# Patient Record
Sex: Male | Born: 1950 | Race: Black or African American | Hispanic: No | Marital: Married | Smoking: Former smoker
Health system: Southern US, Community
[De-identification: ages and names within clinical notes are randomized; demographics above are authoritative.]

## PROBLEM LIST (undated history)

## (undated) DIAGNOSIS — T7840XA Allergy, unspecified, initial encounter: Secondary | ICD-10-CM

## (undated) DIAGNOSIS — M1990 Unspecified osteoarthritis, unspecified site: Secondary | ICD-10-CM

## (undated) DIAGNOSIS — N200 Calculus of kidney: Secondary | ICD-10-CM

## (undated) DIAGNOSIS — I10 Essential (primary) hypertension: Secondary | ICD-10-CM

## (undated) DIAGNOSIS — E785 Hyperlipidemia, unspecified: Secondary | ICD-10-CM

## (undated) HISTORY — PX: COLONOSCOPY: SHX174

## (undated) HISTORY — DX: Unspecified osteoarthritis, unspecified site: M19.90

## (undated) HISTORY — PX: HERNIA REPAIR: SHX51

## (undated) HISTORY — DX: Calculus of kidney: N20.0

## (undated) HISTORY — DX: Allergy, unspecified, initial encounter: T78.40XA

## (undated) HISTORY — DX: Essential (primary) hypertension: I10

## (undated) HISTORY — DX: Hyperlipidemia, unspecified: E78.5

---

## 1999-09-03 ENCOUNTER — Ambulatory Visit (HOSPITAL_COMMUNITY): Admission: RE | Admit: 1999-09-03 | Discharge: 1999-09-03 | Payer: Self-pay | Admitting: Anesthesiology

## 2000-05-19 ENCOUNTER — Emergency Department (HOSPITAL_COMMUNITY): Admission: EM | Admit: 2000-05-19 | Discharge: 2000-05-19 | Payer: Self-pay | Admitting: Emergency Medicine

## 2001-04-29 ENCOUNTER — Encounter: Payer: Self-pay | Admitting: Urology

## 2001-04-29 ENCOUNTER — Encounter: Admission: RE | Admit: 2001-04-29 | Discharge: 2001-04-29 | Payer: Self-pay | Admitting: Urology

## 2010-01-01 ENCOUNTER — Ambulatory Visit (HOSPITAL_BASED_OUTPATIENT_CLINIC_OR_DEPARTMENT_OTHER): Admission: RE | Admit: 2010-01-01 | Discharge: 2010-01-01 | Payer: Self-pay | Admitting: General Surgery

## 2010-06-13 LAB — URINALYSIS, ROUTINE W REFLEX MICROSCOPIC
Bilirubin Urine: NEGATIVE
Glucose, UA: NEGATIVE mg/dL
Hgb urine dipstick: NEGATIVE
Ketones, ur: NEGATIVE mg/dL
Nitrite: NEGATIVE
Protein, ur: NEGATIVE mg/dL
Specific Gravity, Urine: 1.023 (ref 1.005–1.030)
Urobilinogen, UA: 1 mg/dL (ref 0.0–1.0)
pH: 7.5 (ref 5.0–8.0)

## 2010-06-13 LAB — POCT HEMOGLOBIN-HEMACUE: Hemoglobin: 13.5 g/dL (ref 13.0–17.0)

## 2010-08-16 NOTE — Procedures (Signed)
[place]  Patient:    Leonard Bryant, Leonard Bryant                           MRN: [id number] Proc. Date: 09/03/99 Adm. Date:  [id number] Disc. Date: [id number] Attending:  Orland Mustard CC:         Reuben Likes, M.D.                           Procedure Report  PROCEDURE:  Colonoscopy.  MEDICATIONS:  Fentanyl 90 mcg, Versed 10 mg IV.  COLONOSCOPE:  Olympus adult video colonoscope.  INDICATION:  Increasing constipation, strong family history of GI neoplasia, multiple polyps removed in several family members.  DESCRIPTION OF PROCEDURE:  The procedure had been explained to the patient and consent obtained.  With the patient in the left lateral decubitus position, the Olympus adult video colonoscope inserted following digital rectal exam and advanced under direct visualization.  Prep was excellent, and we were able to advance to the cecum without difficulty.  The scope was withdrawn, and the cecum, ascending colon, hepatic flexure, transverse colon, splenic flexure, descending and sigmoid colon were seen well upon removal without significant diverticular disease.  No polyps seen throughout.  Small internal hemorrhoids seen in the rectum upon removal.  The patient tolerated the procedure well, maintained on low-flow oxygen and pulse oximetry throughout the procedure without obvious problem.  ASSESSMENT: 1. No polyps or any colonic neoplasia. 2. Internal hemorrhoids.  PLAN:  Due to his family history, will recommend repeating in five years. DD:  09/03/99 TD:  09/06/99 Job: [id number]

## 2011-04-30 ENCOUNTER — Ambulatory Visit (INDEPENDENT_AMBULATORY_CARE_PROVIDER_SITE_OTHER): Payer: Federal, State, Local not specified - PPO | Admitting: Family Medicine

## 2011-04-30 VITALS — BP 122/80 | HR 67 | Temp 97.9°F | Resp 16 | Ht 68.0 in | Wt 183.8 lb

## 2011-04-30 DIAGNOSIS — E785 Hyperlipidemia, unspecified: Secondary | ICD-10-CM | POA: Insufficient documentation

## 2011-04-30 DIAGNOSIS — Z0000 Encounter for general adult medical examination without abnormal findings: Secondary | ICD-10-CM

## 2011-04-30 DIAGNOSIS — M47812 Spondylosis without myelopathy or radiculopathy, cervical region: Secondary | ICD-10-CM

## 2011-04-30 DIAGNOSIS — N400 Enlarged prostate without lower urinary tract symptoms: Secondary | ICD-10-CM

## 2011-04-30 LAB — POCT CBC
Granulocyte percent: 54.3 %G (ref 37–80)
HCT, POC: 40.7 % — AB (ref 43.5–53.7)
Hemoglobin: 13.4 g/dL — AB (ref 14.1–18.1)
Lymph, poc: 1.2 (ref 0.6–3.4)
MCH, POC: 27.8 pg (ref 27–31.2)
MCHC: 32.9 g/dL (ref 31.8–35.4)
MCV: 84.4 fL (ref 80–97)
MID (cbc): 0.3 (ref 0–0.9)
MPV: 8.7 fL (ref 0–99.8)
POC Granulocyte: 1.8 — AB (ref 2–6.9)
POC LYMPH PERCENT: 37.2 %L (ref 10–50)
POC MID %: 8.5 %M (ref 0–12)
Platelet Count, POC: 222 10*3/uL (ref 142–424)
RBC: 4.82 M/uL (ref 4.69–6.13)
RDW, POC: 15.1 %
WBC: 3.3 10*3/uL — AB (ref 4.6–10.2)

## 2011-04-30 LAB — POCT GLYCOSYLATED HEMOGLOBIN (HGB A1C): Hemoglobin A1C: 5.2

## 2011-04-30 LAB — POCT UA - MICROSCOPIC ONLY
Casts, Ur, LPF, POC: NEGATIVE
Mucus, UA: NEGATIVE
Yeast, UA: NEGATIVE

## 2011-04-30 LAB — POCT URINALYSIS DIPSTICK
Leukocytes, UA: NEGATIVE
Nitrite, UA: NEGATIVE
Protein, UA: NEGATIVE
Urobilinogen, UA: 0.2
pH, UA: 7.5

## 2011-04-30 LAB — IFOBT (OCCULT BLOOD): IFOBT: NEGATIVE

## 2011-04-30 NOTE — Progress Notes (Signed)
  Subjective:    Patient ID: Leonard Bryant, male    DOB: 03-07-1951, 61 y.o.   MRN: [id number]  HPI patient is here for complete physical examination. Primary concerns include pains in the left side of his neck tingling there. He also has had problems with nasal congestion and seeing some blood in his nose. He does have a history of frequent urination which possibly could be prostate related. Lower and upper back pains. Takes some ibuprofen for that.    Review of Systems  Constitutional: Negative.   HENT: Positive for nosebleeds, congestion and neck pain. Negative for rhinorrhea, sneezing and postnasal drip.   Eyes: Negative.   Respiratory: Negative.   Cardiovascular: Negative.   Gastrointestinal: Negative.   Genitourinary: Positive for frequency.  Musculoskeletal: Positive for back pain.  Neurological: Negative.   Hematological: Negative.   Psychiatric/Behavioral: Negative.    Does give a tingly sensation down the left side of his neck.    Objective:   Physical Exam        Assessment & Plan:

## 2011-05-01 LAB — LIPID PANEL
HDL: 46 mg/dL (ref >39)
LDL Cholesterol: 105 mg/dL — ABNORMAL HIGH (ref 0–99)
Total CHOL/HDL Ratio: 3.5 Ratio
Triglycerides: 49 mg/dL (ref <150)

## 2011-05-01 LAB — COMPREHENSIVE METABOLIC PANEL
ALT: 23 U/L (ref 0–53)
AST: 26 U/L (ref 0–37)
Alkaline Phosphatase: 48 U/L (ref 39–117)
Creat: 0.9 mg/dL (ref 0.50–1.35)
Sodium: 139 mEq/L (ref 135–145)
Total Bilirubin: 0.6 mg/dL (ref 0.3–1.2)

## 2011-05-01 LAB — PSA: PSA: 2.87 ng/mL (ref <=4.00)

## 2011-05-02 ENCOUNTER — Telehealth: Payer: Self-pay | Admitting: Family Medicine

## 2011-05-02 NOTE — Telephone Encounter (Signed)
Left message on the machine that patient's labs were normal. However his PSA he has in the upper normal range. He is he was advised nothing need be done with this, please make sure that he gets recheck next year

## 2011-07-03 ENCOUNTER — Other Ambulatory Visit: Payer: Self-pay | Admitting: Emergency Medicine

## 2012-02-13 ENCOUNTER — Telehealth: Payer: Self-pay

## 2012-02-13 NOTE — Telephone Encounter (Signed)
Patient advised labs at front desk

## 2012-02-13 NOTE — Telephone Encounter (Signed)
Pt had a CPE in January of 2013 and would like a copy of his labs. Please call when ready and he will pickup. Leonard Bryant  [phone]

## 2012-02-18 ENCOUNTER — Encounter: Payer: Federal, State, Local not specified - PPO | Admitting: Family Medicine

## 2012-05-05 ENCOUNTER — Encounter: Payer: Self-pay | Admitting: Family Medicine

## 2012-05-05 ENCOUNTER — Ambulatory Visit (INDEPENDENT_AMBULATORY_CARE_PROVIDER_SITE_OTHER): Payer: Federal, State, Local not specified - PPO | Admitting: Family Medicine

## 2012-05-05 VITALS — BP 140/90 | HR 65 | Temp 98.8°F | Resp 16 | Ht 68.5 in | Wt 178.0 lb

## 2012-05-05 DIAGNOSIS — N400 Enlarged prostate without lower urinary tract symptoms: Secondary | ICD-10-CM

## 2012-05-05 DIAGNOSIS — Z862 Personal history of diseases of the blood and blood-forming organs and certain disorders involving the immune mechanism: Secondary | ICD-10-CM

## 2012-05-05 DIAGNOSIS — J309 Allergic rhinitis, unspecified: Secondary | ICD-10-CM

## 2012-05-05 DIAGNOSIS — E7800 Pure hypercholesterolemia, unspecified: Secondary | ICD-10-CM

## 2012-05-05 DIAGNOSIS — Z23 Encounter for immunization: Secondary | ICD-10-CM

## 2012-05-05 DIAGNOSIS — Z0000 Encounter for general adult medical examination without abnormal findings: Secondary | ICD-10-CM

## 2012-05-05 LAB — BASIC METABOLIC PANEL
CO2: 24 mEq/L (ref 19–32)
Calcium: 9.8 mg/dL (ref 8.4–10.5)
Chloride: 109 mEq/L (ref 96–112)
Creat: 0.96 mg/dL (ref 0.50–1.35)
Glucose, Bld: 100 mg/dL — ABNORMAL HIGH (ref 70–99)
Sodium: 140 mEq/L (ref 135–145)

## 2012-05-05 LAB — POCT URINALYSIS DIPSTICK
Bilirubin, UA: NEGATIVE
Glucose, UA: NEGATIVE
Ketones, UA: NEGATIVE
Leukocytes, UA: NEGATIVE
Nitrite, UA: NEGATIVE
pH, UA: 7

## 2012-05-05 MED ORDER — FLUTICASONE PROPIONATE 50 MCG/ACT NA SUSP: 2.0000 | Freq: Every day | NASAL | Status: DC

## 2012-05-05 NOTE — Progress Notes (Signed)
Subjective:    Patient ID: Leonard Bryant, male    DOB: May 08, 1950, 62 y.o.   MRN: [id number]  HPI  This 62 y.o. AA male is here for CPE. He checks BP at home- readings= 120-140/80. He  exercises most days of the week, is a nonsmoker and has 1 alcoholic beverage daily. 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   Review of Systems  Constitutional: Negative.   HENT: Positive for hearing loss, congestion and neck pain.   Eyes: Negative.   Respiratory: Negative.   Cardiovascular: Negative.   Gastrointestinal: Negative.   Genitourinary: Negative.   Musculoskeletal:       Pt reports epigastric tightness only when bending over or with exertion such as heavy lifting; no other associated symptoms.  Skin: Negative.   Neurological: Negative.   Hematological: Negative.   Psychiatric/Behavioral: Negative.        Objective:   Physical Exam  Nursing note and vitals reviewed. Constitutional: He is oriented to person, place, and time. Vital signs are normal. He appears well-developed and well-nourished. No distress.  HENT:  Head: Normocephalic and atraumatic.  Right Ear: Tympanic membrane, external ear and ear canal normal. Decreased hearing is noted.  Left Ear: Tympanic membrane, external ear and ear canal normal. Decreased hearing is noted.  Nose: Mucosal edema present. No nasal deformity or septal deviation. Right sinus exhibits no maxillary sinus tenderness and no frontal sinus tenderness. Left sinus exhibits no maxillary sinus tenderness and no frontal sinus tenderness.  Mouth/Throat: Uvula is midline and mucous membranes are normal. Posterior oropharyngeal erythema present. No oropharyngeal exudate.  Eyes: Conjunctivae normal, EOM and lids are normal. Pupils are equal, round, and reactive to light. No scleral icterus.       Pt is wearing glasses and has eye care specialty exam.  Neck: Normal range of motion. No JVD present. No thyromegaly present.       ROM reduced w/ rotation; no crepitus.   Cardiovascular: Normal rate, regular rhythm, normal heart sounds and intact distal pulses.  Exam reveals no gallop and no friction rub.   No murmur heard. Pulmonary/Chest: Effort normal and breath sounds normal. No respiratory distress. He exhibits no tenderness.  Abdominal: Soft. Bowel sounds are normal. Hernia confirmed negative in the right inguinal area and confirmed negative in the left inguinal area.  Genitourinary: Rectum normal, testes normal and penis normal. Rectal exam shows no external hemorrhoid, no fissure, no mass, no tenderness and anal tone normal. Guaiac negative stool. Prostate is enlarged. Prostate is not tender.  Musculoskeletal: Normal range of motion. He exhibits no edema and no tenderness.  Lymphadenopathy:    He has no cervical adenopathy.       Right: No inguinal adenopathy present.       Left: No inguinal adenopathy present.  Neurological: He is alert and oriented to person, place, and time. He has normal reflexes. No cranial nerve deficit. He exhibits normal muscle tone. Coordination normal.  Skin: Skin is warm and dry. No rash noted. No erythema.  Psychiatric: He has a normal mood and affect. His behavior is normal. Judgment and thought content normal.         Assessment & Plan:   1. Routine general medical examination at a health care facility  CBC with Differential, Basic metabolic panel, IFOBT POC (occult bld, rslt in office), POCT urinalysis dipstick  2. History of anemia w/ low WBCs Recheck CBC  3. Allergic rhinitis  RX: Fluticasone NS  Use hs  4.  Enlarged prostate  PSA  5. Elevated LDL cholesterol = 105 in 04/2011 LDL cholesterol, direct   Pt declined Tdap and Influenza vaccine. He was given RX; Zostavax.   Meds ordered this encounter  Medications  . OVER THE COUNTER MEDICATION    Sig: OTC Magnesium taking one tablet twice a day  . fluticasone (FLONASE) 50 MCG/ACT nasal spray    Sig: Place 2 sprays into the nose daily.    Dispense:  16 g     Refill:  3

## 2012-05-05 NOTE — Patient Instructions (Addendum)
Keeping you healthy  Get these tests  Blood pressure- Have your blood pressure checked once a year by your healthcare provider.  Normal blood pressure is 120/80  Weight- Have your body mass index (BMI) calculated to screen for obesity.  BMI is a measure of body fat based on height and weight. You can also calculate your own BMI at [email or web address].  Cholesterol- Have your cholesterol checked every year.  Diabetes- Have your blood sugar checked regularly if you have high blood pressure, high cholesterol, have a family history of diabetes or if you are overweight.  Screening for Colon Cancer- Colonoscopy starting at age 3.  Screening may begin sooner depending on your family history and other health conditions. Follow up colonoscopy as directed by your Gastroenterologist.  Screening for Prostate Cancer- Both blood work (PSA) and a rectal exam help screen for Prostate Cancer.  Screening begins at age 45 with African-American men and at age 85 with Caucasian men.  Screening may begin sooner depending on your family history.  Take these medicines  Aspirin- One aspirin daily can help prevent Heart disease and Stroke.  Flu shot- Every fall.  Tetanus- Every 10 years. You need to have Tdap vaccine; this is a one-time immunization.  Zostavax- Once after the age of 5 to prevent Shingles. You have the prescription for this vaccine.  Pneumonia shot- Once after the age of 16; if you are younger than 42, ask your healthcare provider if you need a Pneumonia shot.  Take these steps  Don't smoke- If you do smoke, talk to your doctor about quitting.  For tips on how to quit, go to www.smokefree.gov or call 1-800-QUIT-NOW.  Be physically active- Exercise 5 days a week for at least 30 minutes.  If you are not already physically active start slow and gradually work up to 30 minutes of moderate physical activity.  Examples of moderate activity include walking briskly, mowing the yard, dancing,  swimming, bicycling, etc.  Eat a healthy diet- Eat a variety of healthy food such as fruits, vegetables, low fat milk, low fat cheese, yogurt, lean meant, poultry, fish, beans, tofu, etc. For more information go to www.thenutritionsource.org  Drink alcohol in moderation- Limit alcohol intake to less than two drinks a day. Never drink and drive.  Dentist- Brush and floss twice daily; visit your dentist twice a year.  Depression- Your emotional health is as important as your physical health. If you're feeling down, or losing interest in things you would normally enjoy please talk to your healthcare provider.  Eye exam- Visit your eye doctor every year.  Safe sex- If you may be exposed to a sexually transmitted infection, use a condom.  Seat belts- Seat belts can save your life; always wear one.  Smoke/Carbon Monoxide detectors- These detectors need to be installed on the appropriate level of your home.  Replace batteries at least once a year.  Skin cancer- When out in the sun, cover up and use sunscreen 15 SPF or higher.  Violence- If anyone is threatening you, please tell your healthcare provider.  Living Will/ Health care power of attorney- Speak with your healthcare provider and family.   Allergies- get over-the-counter Cetirizine (Zyrtec) 10 mg and take 1 tablet every evening. Also try a humidifier in your bedroom to add moisture to the air.   Prostate Problems The prostate gland is part of the reproductive system of men. A normal prostate is about the size and shape of a walnut. The prostate may grow as a  man ages. The gland makes a fluid that is mixed with sperm to make semen. This gland is located in front of the rectum and just below the bladder, where urine is stored. The prostate surrounds the urethra. The urethra is the tube through which urine passes out of the body. COMMON PROSTATE PROBLEMS  Prostatitis  The most common prostate problem in men under 50 is inflammation of  the prostate gland (prostatitis).  This is generally an infection that enters the prostate from the urethra.  It may be sexually transmitted.  It could be caused by a slow growing cancer.  If not caused by cancer, treatment with antibiotics is usually very effective. In some cases, symptoms may be slow to go away and may come back. This condition is commonly called chronic prostatitis.  Benign Prostatic Hypertrophy (BPH)  BPH is an enlarged prostate.  There is no cancer present with this condition.  The exact cause is not known; but it is one of the most common problems for men over age 64.  If your caregiver finds BPH, but there are no symptoms or mild symptoms, you may need examinations once or twice a year.  Prostate cancer  Symptoms of prostate cancer will vary depending on how big the tumor is and whether it has spread beyond the prostate. If it has spread, your caregiver must find out how far it has spread.  Prostate cancer is treated by various combinations of surgery, radiation therapy, hormonal therapy, and chemotherapy. Sometimes the prostate cancer is just observed to determine the need for treatment.  Some men with enlarged prostates have no symptoms at all.  Symptoms vary a lot. They are usually referred to as "lower urinary tract symptoms" (LUTS). SYMPTOMS   Frequent urination.  Getting up often during the night to urinate.  A feeling that you still have a full bladder after passing your urine.  A weak stream or dribbling after urination.  Having to push or strain to pass your urine.  Fever.  Pain in the low back or groin.  Blood in the urine.  Discharge from the penis.  Weight loss.  There may be visible enlargement of the bladder.  In severe cases, you may not be able to empty your bladder and there is severe pain. This is an emergency and requires immediate medical care. If this occurs many times, you can develop permanent damage to the bladder  and kidneys.  Different prostate problems may have similar symptoms. In the early stages, there may be no symptoms at all. DIAGNOSIS   If you have urination problems, or a digital rectal exam (DRE) or prostate-specific antigen (PSA) test indicates that you might have a problem, additional tests will be suggested.  Ask your caregiver if any special preparations are needed before your diagnostic tests. TREATMENT   If your caregiver finds BPH and you have bothersome symptoms, medications can be taken by mouth to help.  If medications are not helpful, surgery may be advised. Different procedures use different methods to heat, destroy, and remove a small amount of the prostate tissue. These methods include the use of:  Microwaves.  High frequency sound waves.  A laser.  An Teaching laboratory technician.  Other surgeries are available. All of these are preceded by appropriate anesthesia.  The surgeon scrapes away part of the inside of the prostate using a small scope put into the urethra. This reduces the squeezing on the urethra.  The surgeon makes small cuts in the prostate to reduce  the squeezing pressure on the urethra.  Removal of the entire prostate is carried out through a small incision.  Removal of the entire prostate through a larger incision may occur in situations where the surgeon feels the other operations are not appropriate. TYPES OF TESTS DRE  You may be asked to bend over a table or to lie on your side holding your knees close to your chest. Your caregiver advances a gloved, lubricated finger into the rectum and feels the part of the prostate that lies next to it. You may find the DRE slightly uncomfortable, but it is very brief.  This exam tells the caregiver whether the gland has any bumps, irregularities, or soft or hard spots that require additional tests.  If a prostate infection is suspected, the caregiver might press the prostate during the DRE to obtain fluid for  examination under a microscope. PSA Blood Test  The amount of PSA, a protein produced by prostate cells, is often higher in the blood of men who have prostate cancer. However, an elevated level of PSA does not necessarily mean you have cancer.  Healthy men should no longer receive PSA blood tests as part of routine cancer screening. Consult with your caregiver about prostate cancer screening. Urinalysis  This can help find an infection if one is present. Transrectal Ultrasound and Prostate Biopsy  If prostate cancer is suspected, your caregiver may recommend a transrectal ultrasound.  A probe is inserted into the rectum. The probe directs high frequency sound waves at the prostate, and the created image is visible on a monitor screen.  The image shows the size of the prostate and any abnormalities. This cannot clearly identify tumors.  To determine whether an abnormality is a tumor, the caregiver may use the probe and the ultrasound images to guide a biopsy needle to the abnormality. Prostate tissue samples will be collected for examination under a microscope. A specialist will look at the tissue samples to see if cancer is present. MRI and CT Scans  MRI and CT scans both use computers to create images of internal organs.  These tests can help identify abnormal structures.  They cannot show a difference between cancerous tumors and noncancerous growths.  If a biopsy confirms cancer, your caregiver might use these imaging techniques to determine how far the cancer has spread. In most cases, these tests are not required. Your caregiver will discuss the need for these tests if he or she feels they are indicated. Urodynamic Tests  A weak stream of urine and difficulty emptying the bladder fully may be signs of urine blockage caused by an enlarged prostate that is squeezing the urethra.  If your problem appears to be related to a blockage, your caregiver may recommend tests that measure  bladder pressure and urine flow rate.  You may be asked to urinate into a device that measures how quickly the urine is flowing. It will record how many seconds it takes for the peak flow rate to be reached.  Another test measures post-void residual. This is the amount of urine left in your bladder when you have finished passing urine. Intravenous Pyelogram (IVP)   IVP is an X-ray of the urinary tract.  In this test, a fluid (contrast) is injected into a vein. X-ray pictures are taken at different times to see the progression of contrast through the kidney and ureter.  The contrast makes the urine visible on the X-ray and shows any narrowing or blockage in the urinary tract.  This procedure can help show problems in the kidneys, ureters, or bladder that may have come from urine retention or backup. Abdominal Ultrasound  For an abdominal ultrasound exam, gel will be applied to your lower abdomen. A handheld device will be moved across the lower abdomen to record a picture of your entire urinary tract.  An abdominal ultrasound can show damage in the upper urinary tract that comes from urine blockage. Cystoscopy  A solution will numb the inside of the penis. A small tube (cystoscope) is inserted through the urethral opening at the tip of the penis.  The tube allows your caregiver to see the inside of the urethra and bladder.  The caregiver can determine the location and amount of the obstruction causing problems. Finding out the results of your test Not all test results are available during your visit. If your test results are not back during the visit, make an appointment with your caregiver to find out the results. Do not assume everything is normal if you have not heard from your caregiver or the medical facility. It is important for you to follow up on all of your test results. HOME CARE INSTRUCTIONS  Home care instructions after diagnostic testing will vary dependent upon the procedure  performed.  Care after urodynamic tests or a cystoscopy:  You may have mild discomfort for a few hours.  Drinking two, 8 ounce (240 mL) glasses of water each hour, for 2 hours should help.  Ask your caregiver whether you can take a warm bath. If not, you may be able to hold a warm, damp washcloth over the urethral opening to relieve the discomfort.  Care after a prostate biopsy:  A prostate biopsy may produce pain in the area of the rectum and the area between the rectum and the scrotum (the perineum).  Only take over-the-counter or prescription medications for pain, discomfort, or fever as directed by your caregiver.  You may be given antibiotics to prevent an infection. SEEK MEDICAL CARE IF:  You have any sign of an infection including pain with urination, chills, or fever. FOR MORE INFORMATION American Foundation for Urologic Disease: www.urologyhealth.org [employer] ([employer]): GemRingtones.nl The [employer] of Diabetes and Digestive and Kidney Diseases (NIDDK): CarFlippers.tn The Prostatitis Foundation: www.prostatitis.org Document Released: 01/12/2007 Document Revised: 06/09/2011 Document Reviewed: 09/29/2008 [facility] Patient Information 2013 [place], [place].  You will be contacted about results of blood tests and whether or not further evaluation of the prostate gland is needed.

## 2012-05-06 ENCOUNTER — Encounter: Payer: Self-pay | Admitting: Family Medicine

## 2012-05-06 DIAGNOSIS — J309 Allergic rhinitis, unspecified: Secondary | ICD-10-CM | POA: Insufficient documentation

## 2012-05-06 DIAGNOSIS — D72819 Decreased white blood cell count, unspecified: Secondary | ICD-10-CM | POA: Insufficient documentation

## 2012-05-06 LAB — CBC WITH DIFFERENTIAL/PLATELET
Eosinophils Absolute: 0.1 10*3/uL (ref 0.0–0.7)
Eosinophils Relative: 4 % (ref 0–5)
HCT: 41.2 % (ref 39.0–52.0)
Hemoglobin: 14.6 g/dL (ref 13.0–17.0)
Lymphocytes Relative: 29 % (ref 12–46)
Lymphs Abs: 0.9 10*3/uL (ref 0.7–4.0)
MCH: 28.3 pg (ref 26.0–34.0)
MCV: 79.8 fL (ref 78.0–100.0)
Monocytes Relative: 9 % (ref 3–12)
RBC: 5.16 MIL/uL (ref 4.22–5.81)

## 2012-05-09 ENCOUNTER — Encounter: Payer: Self-pay | Admitting: Family Medicine

## 2012-05-15 ENCOUNTER — Other Ambulatory Visit: Payer: Self-pay

## 2012-07-02 ENCOUNTER — Other Ambulatory Visit: Payer: Self-pay | Admitting: Radiology

## 2012-07-02 MED ORDER — FLUTICASONE PROPIONATE 50 MCG/ACT NA SUSP: 2.0000 | Freq: Every day | NASAL | Status: DC

## 2012-07-02 NOTE — Telephone Encounter (Signed)
Changed from 30 day with refills to 90 day supply, received fax from pharmacy.

## 2012-12-25 ENCOUNTER — Emergency Department (HOSPITAL_COMMUNITY)
Admission: EM | Admit: 2012-12-25 | Discharge: 2012-12-25 | Disposition: A | Discharge status: Home / Self Care | Payer: Federal, State, Local not specified - PPO | Attending: Emergency Medicine | Admitting: Emergency Medicine

## 2012-12-25 ENCOUNTER — Emergency Department (HOSPITAL_COMMUNITY): Payer: Federal, State, Local not specified - PPO

## 2012-12-25 ENCOUNTER — Encounter (HOSPITAL_COMMUNITY): Payer: Self-pay | Admitting: Emergency Medicine

## 2012-12-25 DIAGNOSIS — Z8709 Personal history of other diseases of the respiratory system: Secondary | ICD-10-CM | POA: Insufficient documentation

## 2012-12-25 DIAGNOSIS — Z87891 Personal history of nicotine dependence: Secondary | ICD-10-CM | POA: Insufficient documentation

## 2012-12-25 DIAGNOSIS — Z7982 Long term (current) use of aspirin: Secondary | ICD-10-CM | POA: Insufficient documentation

## 2012-12-25 DIAGNOSIS — Z8739 Personal history of other diseases of the musculoskeletal system and connective tissue: Secondary | ICD-10-CM | POA: Insufficient documentation

## 2012-12-25 DIAGNOSIS — N400 Enlarged prostate without lower urinary tract symptoms: Secondary | ICD-10-CM | POA: Insufficient documentation

## 2012-12-25 DIAGNOSIS — N200 Calculus of kidney: Secondary | ICD-10-CM

## 2012-12-25 DIAGNOSIS — E785 Hyperlipidemia, unspecified: Secondary | ICD-10-CM | POA: Insufficient documentation

## 2012-12-25 DIAGNOSIS — M79609 Pain in unspecified limb: Secondary | ICD-10-CM | POA: Insufficient documentation

## 2012-12-25 LAB — CBC WITH DIFFERENTIAL/PLATELET
Basophils Relative: 1 % (ref 0–1)
Eosinophils Relative: 2 % (ref 0–5)
HCT: 40.1 % (ref 39.0–52.0)
Hemoglobin: 13.9 g/dL (ref 13.0–17.0)
Lymphs Abs: 0.7 10*3/uL (ref 0.7–4.0)
MCHC: 34.7 g/dL (ref 30.0–36.0)
Monocytes Absolute: 0.3 10*3/uL (ref 0.1–1.0)
Monocytes Relative: 8 % (ref 3–12)
Neutro Abs: 2.8 10*3/uL (ref 1.7–7.7)
Neutrophils Relative %: 72 % (ref 43–77)
Platelets: 217 10*3/uL (ref 150–400)
RBC: 4.83 MIL/uL (ref 4.22–5.81)

## 2012-12-25 LAB — URINALYSIS, ROUTINE W REFLEX MICROSCOPIC
Bilirubin Urine: NEGATIVE
Glucose, UA: NEGATIVE mg/dL
Ketones, ur: NEGATIVE mg/dL
Leukocytes, UA: NEGATIVE
Nitrite: NEGATIVE
Protein, ur: NEGATIVE mg/dL
Specific Gravity, Urine: 1.023 (ref 1.005–1.030)
pH: 7 (ref 5.0–8.0)

## 2012-12-25 LAB — LIPASE, BLOOD: Lipase: 24 U/L (ref 11–59)

## 2012-12-25 LAB — COMPREHENSIVE METABOLIC PANEL
Albumin: 4.1 g/dL (ref 3.5–5.2)
Alkaline Phosphatase: 53 U/L (ref 39–117)
BUN: 22 mg/dL (ref 6–23)
CO2: 28 mEq/L (ref 19–32)
Calcium: 9.7 mg/dL (ref 8.4–10.5)
Chloride: 106 mEq/L (ref 96–112)
Creatinine, Ser: 1.4 mg/dL — ABNORMAL HIGH (ref 0.50–1.35)
GFR calc Af Amer: 62 mL/min — ABNORMAL LOW (ref >90)
GFR calc non Af Amer: 53 mL/min — ABNORMAL LOW (ref >90)
Glucose, Bld: 134 mg/dL — ABNORMAL HIGH (ref 70–99)
Potassium: 4.3 mEq/L (ref 3.5–5.1)
Total Bilirubin: 0.3 mg/dL (ref 0.3–1.2)

## 2012-12-25 LAB — URINE MICROSCOPIC-ADD ON

## 2012-12-25 MED ORDER — SODIUM CHLORIDE 0.9 % IV BOLUS (SEPSIS)
1000.0000 mL | Freq: Once | INTRAVENOUS | Status: AC
  Administered 2012-12-25: 1000 mL via INTRAVENOUS

## 2012-12-25 MED ORDER — KETOROLAC TROMETHAMINE 30 MG/ML IJ SOLN: 30.0000 mg | Freq: Once | INTRAMUSCULAR | Status: DC

## 2012-12-25 MED ORDER — HYDROCODONE-ACETAMINOPHEN 5-325 MG PO TABS: 1.0000 | ORAL_TABLET | ORAL | Status: DC | PRN

## 2012-12-25 MED ORDER — IBUPROFEN 800 MG PO TABS
800.0000 mg | ORAL_TABLET | Freq: Once | ORAL | Status: AC
  Administered 2012-12-25: 800 mg via ORAL
  Filled 2012-12-25: qty 1

## 2012-12-25 MED ORDER — TAMSULOSIN HCL 0.4 MG PO CAPS: 0.4000 mg | ORAL_CAPSULE | Freq: Every day | ORAL | Status: DC

## 2012-12-25 NOTE — ED Provider Notes (Signed)
Medical screening examination/treatment/procedure(s) were performed by non-physician practitioner and as supervising physician I was immediately available for consultation/collaboration.  Shamell Suarez M Elanda Garmany, MD 12/25/12 0657 

## 2012-12-25 NOTE — ED Notes (Signed)
Pt c/o L side/flank pain radiating down L leg to foot onset 0100, worse with movement.

## 2012-12-25 NOTE — ED Provider Notes (Signed)
CSN: [id number]     Arrival date & time 12/25/12  0222 History   First MD Initiated Contact with Patient 12/25/12 [phone]     Chief Complaint  Patient presents with  . Flank Pain  . Leg Pain   HPI  History provided by the patient. The patient is a 62 year old male who presents with complaints of acute onset of left-sided pain radiating into his left lower leg. Patient awoke around 1 AM with a sharp pain and cramping sensation to his left side. Patient also complains of pain down his left extremity with a "unusual" sensation extending to the foot. He denies any weakness to the leg. He has not used any medications or other treatments for symptoms. There is no associated fever, chills or sweats. No nausea vomiting. Denies any issues with diarrhea or constipation. Denies similar symptoms previously. No dysuria, hematuria urinary frequency. No prior history of kidney stones.    Past Medical History  Diagnosis Date  . Allergy     congestion  . 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 Hyperlipidemia     Was borderline in the past   Past Surgical History  Procedure Laterality Date  . Hernia repair      Femoral?   No family history on file. History  Substance Use Topics  . Smoking status: Former Smoker    Types: Cigarettes  . Smokeless tobacco: Not on file  . Alcohol Use: Yes     Comment: occasional    Review of Systems  Constitutional: Negative for fever, chills and diaphoresis.  Respiratory: Negative for shortness of breath.   Cardiovascular: Negative for chest pain.  Gastrointestinal: Positive for abdominal pain. Negative for nausea, vomiting, diarrhea and constipation.  Genitourinary: Positive for flank pain. Negative for dysuria, frequency, hematuria, penile pain and testicular pain.  Musculoskeletal: Negative for back pain.  All other systems reviewed and are negative.    Allergies  Pseudoephedrine  Home Medications   Current Outpatient Rx  Name  Route  Sig   Dispense  Refill  . aspirin 325 MG tablet   Oral   Take 165 mg by mouth every morning.         . fluticasone (FLONASE) 50 MCG/ACT nasal spray   Nasal   Place 2 sprays into the nose daily.   48 g   0     Ok to dispense 90 day supply   . ibuprofen (ADVIL,MOTRIN) 200 MG tablet   Oral   Take 400 mg by mouth every 6 (six) hours as needed for pain.          . Magnesium Citrate 100 MG TABS   Oral   Take 1-2 tablets by mouth 2 (two) times daily.         . Multiple Vitamin (MULTIVITAMIN WITH MINERALS) TABS tablet   Oral   Take 1 tablet by mouth every morning.          BP 160/77  Pulse 68  Temp(Src) 98.2 F (36.8 C) (Oral)  Resp 18  SpO2 100% Physical Exam  Nursing note and vitals reviewed. Constitutional: He is oriented to person, place, and time. He appears well-developed and well-nourished.  HENT:  Head: Normocephalic.  Eyes: Conjunctivae are normal.  Neck: Normal range of motion.  Cardiovascular: Normal rate and regular rhythm.   Pulmonary/Chest: Effort normal and breath sounds normal. No respiratory distress. He has no wheezes. He has no rales.  Abdominal: Soft. There is no hepatosplenomegaly. There is no  rigidity, no rebound, no guarding, no CVA tenderness and no tenderness at McBurney's point.    Tenderness to the left lateral abdomen. No significant CVA tenderness  Musculoskeletal: Normal range of motion. He exhibits no edema and no tenderness.       Left hip: Normal.       Left knee: Normal.       Thoracic back: Normal.       Lumbar back: Normal.       Left upper leg: Normal.       Left lower leg: Normal.  Neurological: He is alert and oriented to person, place, and time.  Skin: Skin is warm. No rash noted. No erythema.  Psychiatric: He has a normal mood and affect. His behavior is normal.    ED Course  Procedures   Results for orders placed during the hospital encounter of 12/25/12  URINALYSIS, ROUTINE W REFLEX MICROSCOPIC      Result Value Range     Color, Urine YELLOW  YELLOW   APPearance CLOUDY (*) CLEAR   Specific Gravity, Urine 1.023  1.005 - 1.030   pH 7.0  5.0 - 8.0   Glucose, UA NEGATIVE  NEGATIVE mg/dL   Hgb urine dipstick LARGE (*) NEGATIVE   Bilirubin Urine NEGATIVE  NEGATIVE   Ketones, ur NEGATIVE  NEGATIVE mg/dL   Protein, ur NEGATIVE  NEGATIVE mg/dL   Urobilinogen, UA 0.2  0.0 - 1.0 mg/dL   Nitrite NEGATIVE  NEGATIVE   Leukocytes, UA NEGATIVE  NEGATIVE  CBC WITH DIFFERENTIAL      Result Value Range   WBC 3.9 (*) 4.0 - 10.5 K/uL   RBC 4.83  4.22 - 5.81 MIL/uL   Hemoglobin 13.9  13.0 - 17.0 g/dL   HCT 16.1  09.6 - 04.5 %   MCV 83.0  78.0 - 100.0 fL   MCH 28.8  26.0 - 34.0 pg   MCHC 34.7  30.0 - 36.0 g/dL   RDW 40.9  81.1 - 91.4 %   Platelets 217  150 - 400 K/uL   Neutrophils Relative % 72  43 - 77 %   Neutro Abs 2.8  1.7 - 7.7 K/uL   Lymphocytes Relative 18  12 - 46 %   Lymphs Abs 0.7  0.7 - 4.0 K/uL   Monocytes Relative 8  3 - 12 %   Monocytes Absolute 0.3  0.1 - 1.0 K/uL   Eosinophils Relative 2  0 - 5 %   Eosinophils Absolute 0.1  0.0 - 0.7 K/uL   Basophils Relative 1  0 - 1 %   Basophils Absolute 0.0  0.0 - 0.1 K/uL  COMPREHENSIVE METABOLIC PANEL      Result Value Range   Sodium 142  135 - 145 mEq/L   Potassium 4.3  3.5 - 5.1 mEq/L   Chloride 106  96 - 112 mEq/L   CO2 28  19 - 32 mEq/L   Glucose, Bld 134 (*) 70 - 99 mg/dL   BUN 22  6 - 23 mg/dL   Creatinine, Ser 7.82 (*) 0.50 - 1.35 mg/dL   Calcium 9.7  8.4 - 95.6 mg/dL   Total Protein 6.8  6.0 - 8.3 g/dL   Albumin 4.1  3.5 - 5.2 g/dL   AST 25  0 - 37 U/L   ALT 22  0 - 53 U/L   Alkaline Phosphatase 53  39 - 117 U/L   Total Bilirubin 0.3  0.3 - 1.2 mg/dL   GFR calc  non Af Amer 53 (*) >90 mL/min   GFR calc Af Amer 62 (*) >90 mL/min  LIPASE, BLOOD      Result Value Range   Lipase 24  11 - 59 U/L  URINE MICROSCOPIC-ADD ON      Result Value Range   WBC, UA 0-2  <3 WBC/hpf   RBC / HPF TOO NUMEROUS TO COUNT  <3 RBC/hpf     Imaging  Review Ct Abdomen Pelvis Wo Contrast  12/25/2012   *RADIOLOGY REPORT*  Clinical Data: Left flank pain  CT ABDOMEN AND PELVIS WITHOUT CONTRAST  Technique:  Multidetector CT imaging of the abdomen and pelvis was performed following the standard protocol without intravenous contrast.  Comparison: None.  Findings: The visualized lung bases are clear.  Limited noncontrast evaluation the liver is unremarkable.  The gallbladder is within normal limits.  No biliary ductal dilatation. The spleen, adrenal glands, and pancreas demonstrate normal unenhanced appearance.  The right kidney is within normal limits without evidence of hydronephrosis or nephrolithiasis.  No stones are seen along the course of the right renal collecting system.  On the left, a 4 mm obstructive stone is present within the mid left ureter (series 2, image 45).  There is moderate hydroureter and hydronephrosis proximally.  No other definite stones are seen along the course of the left renal collecting system.  Additional well rounded calcifications in the pelvis likely reflect phleboliths. Additional 3 mm nonobstructive stone within the interpolar region of the left kidney is noted.  There is no evidence of bowel obstruction.  Appendix is within normal limits.  No wall thickening or inflammatory fat stranding seen about the bowels.  Bladder is within normal limits.  Prostate is enlarged measuring 5.5 cm in transverse diameter.  No free air or fluid is identified.  No pathologically enlarged intra-abdominal or pelvic lymph nodes are seen.  Multilevel degenerative changes are noted within the visualized spine.  No acute osseous abnormality.  No worrisome lytic or blastic osseous lesions.  IMPRESSION: 1.  4 mm obstructive stone within the mid left ureter with secondary moderate hydronephrosis and hydroureter proximally. 2.  Additional 3 mm nonobstructive stone within a lower pole calix of the left kidney.   Original Report Authenticated By: Rise Mu, M.D.    MDM   1. Kidney stone   2. Enlarged prostate       Patient seen and evaluated. Patient appears uncomfortable rocking in the bed. Initial workup with lab testing and urinalysis ordered. Patient offered pain medication only requesting ibuprofen.  UA with significant amounts of RBC suspect possible kidney stone. Discussed with patient options for continued workup. At this time we'll proceed with noncontrast CT for further evaluation.  Patient feeling better after ibuprofen. CT scan does demonstrate a mid left ureteral stone at 4 mm. I discussed the findings with the patient and the treatment plan with urology followup. He agrees with the plan and is ready to be discharged at this time.   Angus Seller, PA-C 12/25/12 [phone]

## 2012-12-25 NOTE — ED Notes (Signed)
Pt. Refused Toradol inj. at this time,claimed that Ibuprofen 800mg  should help his pain for now.

## 2013-02-03 ENCOUNTER — Other Ambulatory Visit: Payer: Self-pay

## 2013-03-21 ENCOUNTER — Encounter: Payer: Self-pay | Admitting: Family Medicine

## 2013-03-22 ENCOUNTER — Ambulatory Visit (INDEPENDENT_AMBULATORY_CARE_PROVIDER_SITE_OTHER): Payer: Federal, State, Local not specified - PPO | Admitting: Emergency Medicine

## 2013-03-22 ENCOUNTER — Other Ambulatory Visit: Payer: Self-pay | Admitting: Family Medicine

## 2013-03-22 ENCOUNTER — Telehealth: Payer: Self-pay | Admitting: Family Medicine

## 2013-03-22 VITALS — BP 148/80 | HR 79 | Temp 98.0°F | Resp 16 | Ht 65.5 in | Wt 180.0 lb

## 2013-03-22 DIAGNOSIS — N529 Male erectile dysfunction, unspecified: Secondary | ICD-10-CM

## 2013-03-22 DIAGNOSIS — J309 Allergic rhinitis, unspecified: Secondary | ICD-10-CM

## 2013-03-22 DIAGNOSIS — N400 Enlarged prostate without lower urinary tract symptoms: Secondary | ICD-10-CM

## 2013-03-22 MED ORDER — TADALAFIL 5 MG PO TABS: 5.0000 mg | ORAL_TABLET | Freq: Every day | ORAL | Status: DC

## 2013-03-22 MED ORDER — FLUTICASONE PROPIONATE 50 MCG/ACT NA SUSP: 2.0000 | Freq: Every day | NASAL | Status: DC

## 2013-03-22 NOTE — Progress Notes (Addendum)
[facility] [address], [place] [address] [phone]  Date:  03/22/2013   Name:  Leonard Bryant   DOB:  Aug 08, 1950   MRN:  [id number]  PCP:  No primary provider on file.    Chief Complaint: Medication Refill   History of Present Illness:  Leonard Bryant is a 62 y.o. very pleasant male patient who presents with the following:  History of SAR.  Out of flonase and requests a refill.  No fever or chills, nasal discharge, cough, sore throat.  No headache.  Describes hesitancy and dribbling when urinates.  Has urge incontinence.  No nocturia.  Has ED and took viagra in past.  Wants to discuss options. Denies other complaint or health concern today.   Patient Active Problem List   Diagnosis Date Noted  . Leukopenia 05/06/2012  . Allergic rhinitis 05/06/2012  . Hyperlipidemia     Past Medical History  Diagnosis Date  . Allergy     congestion  . 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 Hyperlipidemia     Was borderline in the past    Past Surgical History  Procedure Laterality Date  . Hernia repair      Femoral?    History  Substance Use Topics  . Smoking status: Former Smoker    Types: Cigarettes  . Smokeless tobacco: Not on file  . Alcohol Use: Yes     Comment: occasional    History reviewed. No pertinent family history.  Allergies  Allergen Reactions  . Pseudoephedrine Other (See Comments)    Testicular pain and tenderness  . Penicillins Rash    Medication list has been reviewed and updated.  Current Outpatient Prescriptions on File Prior to Visit  Medication Sig Dispense Refill  . aspirin 325 MG tablet Take 165 mg by mouth every morning.      . fluticasone (FLONASE) 50 MCG/ACT nasal spray Place 2 sprays into the nose daily.  48 g  0  . ibuprofen (ADVIL,MOTRIN) 200 MG tablet Take 400 mg by mouth every 6 (six) hours as needed for pain.       . Magnesium Citrate 100 MG TABS Take 1-2 tablets by mouth 2 (two) times daily.      .  Multiple Vitamin (MULTIVITAMIN WITH MINERALS) TABS tablet Take 1 tablet by mouth every morning.      Marland Kitchen HYDROcodone-acetaminophen (NORCO) 5-325 MG per tablet Take 1 tablet by mouth every 4 (four) hours as needed for pain.  6 tablet  0  . tamsulosin (FLOMAX) 0.4 MG CAPS capsule Take 1 capsule (0.4 mg total) by mouth daily.  30 capsule  0   No current facility-administered medications on file prior to visit.    Review of Systems:  As per HPI, otherwise negative.    Physical Examination: Filed Vitals:   03/22/13 1308  BP: 148/80  Pulse: 79  Temp: 98 F (36.7 C)  Resp: 16   Filed Vitals:   03/22/13 1308  Height: 5' 5.5" (1.664 m)  Weight: 180 lb (81.647 kg)   Body mass index is 29.49 kg/(m^2). Ideal Body Weight: Weight in (lb) to have BMI = 25: 152.2   GEN: WDWN, NAD, Non-toxic, Alert & Oriented x 3 HEENT: Atraumatic, Normocephalic.  Ears and Nose: No external deformity. EXTR: No clubbing/cyanosis/edema NEURO: Normal gait.  PSYCH: Normally interactive. Conversant. Not depressed or anxious appearing.  Calm demeanor.    Assessment and Plan: Seasonal allergic rhinitis Refill ED and  prostatism cialis for daily use   Signed,  Phillips Odor, MD

## 2013-03-22 NOTE — Addendum Note (Signed)
Addended by: Carmelina Dane on: 03/22/2013 02:30 PM   Modules accepted: Orders

## 2013-03-22 NOTE — Telephone Encounter (Signed)
I looks like refills for Fluticasone NS has been authorized. I will double-check this.

## 2013-03-22 NOTE — Patient Instructions (Signed)
Allergic Rhinitis Allergic rhinitis is when the mucous membranes in the nose respond to allergens. Allergens are particles in the air that cause your body to have an allergic reaction. This causes you to release allergic antibodies. Through a chain of events, these eventually cause you to release histamine into the blood stream (hence the use of antihistamines). Although meant to be protective to the body, it is this release that causes your discomfort, such as frequent sneezing, congestion and an itchy runny nose.  CAUSES  The pollen allergens may come from grasses, trees, and weeds. This is seasonal allergic rhinitis, or "hay fever." Other allergens cause year-round allergic rhinitis (perennial allergic rhinitis) such as house dust mite allergen, pet dander and mold spores.  SYMPTOMS   Nasal stuffiness (congestion).  Runny, itchy nose with sneezing and tearing of the eyes.  There is often an itching of the mouth, eyes and ears. It cannot be cured, but it can be controlled with medications. DIAGNOSIS  If you are unable to determine the offending allergen, skin or blood testing may find it. TREATMENT   Avoid the allergen.  Medications and allergy shots (immunotherapy) can help.  Hay fever may often be treated with antihistamines in pill or nasal spray forms. Antihistamines block the effects of histamine. There are over-the-counter medicines that may help with nasal congestion and swelling around the eyes. Check with your caregiver before taking or giving this medicine. If the treatment above does not work, there are many new medications your caregiver can prescribe. Stronger medications may be used if initial measures are ineffective. Desensitizing injections can be used if medications and avoidance fails. Desensitization is when a patient is given ongoing shots until the body becomes less sensitive to the allergen. Make sure you follow up with your caregiver if problems continue. SEEK MEDICAL  CARE IF:   You develop fever (more than 100.5 F (38.1 C).  You develop a cough that does not stop easily (persistent).  You have shortness of breath.  You start wheezing.  Symptoms interfere with normal daily activities. Document Released: 12/10/2000 Document Revised: 06/09/2011 Document Reviewed: 06/21/2008 Exit[employer] Patient Information 2014 [employer].  

## 2013-03-22 NOTE — Telephone Encounter (Signed)
I spoke w/ pt briefly; he had medications refilled to day by Dr. Dareen Piano at [facility]. No other issues to discuss.

## 2013-04-01 ENCOUNTER — Telehealth: Payer: Self-pay

## 2013-04-01 ENCOUNTER — Telehealth: Payer: Self-pay | Admitting: Family Medicine

## 2013-04-01 ENCOUNTER — Other Ambulatory Visit: Payer: Self-pay | Admitting: Family Medicine

## 2013-04-01 MED ORDER — TADALAFIL 5 MG PO TABS: 5.0000 mg | ORAL_TABLET | Freq: Every day | ORAL | Status: DC

## 2013-04-01 NOTE — Telephone Encounter (Signed)
Called to let him know that RX for cealis is sent to [facility]  pharmacy

## 2013-04-03 ENCOUNTER — Telehealth: Payer: Self-pay | Admitting: *Deleted

## 2013-04-03 NOTE — Telephone Encounter (Signed)
lmom that we need to get prior auth for cialis and that we will work on it on Monday because they were closed over the weekend.    Can you please call.  Paper is at your desk with prior auth info from [facility]

## 2013-04-04 NOTE — Telephone Encounter (Signed)
Advised pt I had faxed form to [facility] 12/30 w/confirmation. He talked w/them on Sat and they did not have a note about receiving form. I re-faxed form again w/confirmation and advised pt I will call him when I get a decision.

## 2013-04-08 NOTE — Telephone Encounter (Signed)
PA approved for daily cialis 5 mg through 04/08/14. Notified pt and pharm

## 2013-06-24 ENCOUNTER — Telehealth: Payer: Self-pay

## 2013-06-24 NOTE — Telephone Encounter (Signed)
Patient called stated his insurance paid half of the prescription Cilias, He want to know if their are any discounts for the medication or do we have any coupons.

## 2013-06-25 NOTE — Telephone Encounter (Signed)
Pt notified that he can go online for a coupon at Cialis.com to see if they may have one.

## 2014-03-15 ENCOUNTER — Ambulatory Visit (INDEPENDENT_AMBULATORY_CARE_PROVIDER_SITE_OTHER): Payer: Federal, State, Local not specified - PPO | Admitting: Physician Assistant

## 2014-03-15 VITALS — BP 138/80 | HR 70 | Temp 98.1°F | Resp 16 | Ht 68.0 in | Wt 181.6 lb

## 2014-03-15 DIAGNOSIS — Z125 Encounter for screening for malignant neoplasm of prostate: Secondary | ICD-10-CM

## 2014-03-15 DIAGNOSIS — Z130 Encounter for screening for diseases of the blood and blood-forming organs and certain disorders involving the immune mechanism: Secondary | ICD-10-CM

## 2014-03-15 DIAGNOSIS — Z131 Encounter for screening for diabetes mellitus: Secondary | ICD-10-CM

## 2014-03-15 DIAGNOSIS — Z1211 Encounter for screening for malignant neoplasm of colon: Secondary | ICD-10-CM

## 2014-03-15 DIAGNOSIS — Z13220 Encounter for screening for lipoid disorders: Secondary | ICD-10-CM

## 2014-03-15 DIAGNOSIS — Z1389 Encounter for screening for other disorder: Secondary | ICD-10-CM

## 2014-03-15 DIAGNOSIS — N529 Male erectile dysfunction, unspecified: Secondary | ICD-10-CM

## 2014-03-15 DIAGNOSIS — Z23 Encounter for immunization: Secondary | ICD-10-CM

## 2014-03-15 DIAGNOSIS — N400 Enlarged prostate without lower urinary tract symptoms: Secondary | ICD-10-CM

## 2014-03-15 DIAGNOSIS — Z0000 Encounter for general adult medical examination without abnormal findings: Secondary | ICD-10-CM

## 2014-03-15 LAB — CBC
HEMATOCRIT: 40 % (ref 39.0–52.0)
HEMOGLOBIN: 13.8 g/dL (ref 13.0–17.0)
MCH: 27.6 pg (ref 26.0–34.0)
MCHC: 34.5 g/dL (ref 30.0–36.0)
MCV: 80 fL (ref 78.0–100.0)
MPV: 10 fL (ref 9.4–12.4)
Platelets: 203 10*3/uL (ref 150–400)
RBC: 5 MIL/uL (ref 4.22–5.81)
RDW: 13.5 % (ref 11.5–15.5)
WBC: 3 10*3/uL — AB (ref 4.0–10.5)

## 2014-03-15 LAB — POCT URINALYSIS DIPSTICK
Bilirubin, UA: NEGATIVE
Blood, UA: NEGATIVE
Glucose, UA: NEGATIVE
KETONES UA: 15
LEUKOCYTES UA: NEGATIVE
NITRITE UA: NEGATIVE
PROTEIN UA: NEGATIVE
Spec Grav, UA: 1.015
Urobilinogen, UA: 0.2
pH, UA: 6

## 2014-03-15 LAB — POCT GLYCOSYLATED HEMOGLOBIN (HGB A1C): HEMOGLOBIN A1C: 5.1

## 2014-03-15 LAB — POCT UA - MICROSCOPIC ONLY
Bacteria, U Microscopic: NEGATIVE
Casts, Ur, LPF, POC: NEGATIVE
Crystals, Ur, HPF, POC: NEGATIVE
Epithelial cells, urine per micros: NEGATIVE
MUCUS UA: NEGATIVE
RBC, urine, microscopic: NEGATIVE
WBC, Ur, HPF, POC: NEGATIVE
YEAST UA: NEGATIVE

## 2014-03-15 MED ORDER — SILDENAFIL CITRATE 100 MG PO TABS: 50.0000 mg | ORAL_TABLET | Freq: Every day | ORAL | Status: DC | PRN

## 2014-03-15 MED ORDER — FINASTERIDE 5 MG PO TABS: 5.0000 mg | ORAL_TABLET | Freq: Every day | ORAL | Status: DC

## 2014-03-15 NOTE — Progress Notes (Signed)
Subjective:    Patient ID: Leonard Bryant, male    DOB: 1950-11-22, 63 y.o.   MRN: [id number]  PCP: No primary care provider on file.  Chief Complaint  Patient presents with  . Annual Exam    pt is fasting   Patient Active Problem List   Diagnosis Date Noted  . Leukopenia 05/06/2012  . Allergic rhinitis 05/06/2012  . Hyperlipidemia    Prior to Admission medications   Medication Sig Start Date End Date Taking? Authorizing Provider  fluticasone (FLONASE) 50 MCG/ACT nasal spray Place 2 sprays into both nostrils daily. 03/22/13  Yes Leonard Culver, MD  ibuprofen (ADVIL,MOTRIN) 200 MG tablet Take 400 mg by mouth every 6 (six) hours as needed for pain.    Yes Historical Provider, MD  Multiple Vitamin (MULTIVITAMIN WITH MINERALS) TABS tablet Take 1 tablet by mouth every morning.   Yes Historical Provider, MD  tadalafil (CIALIS) 5 MG tablet Take 1 tablet (5 mg total) by mouth daily. 04/01/13  Yes Leonard Culver, MD   Medications, allergies, past medical history, surgical history, family history, social history and problem list reviewed and updated.  HPI  63 yom presents today for annual CPE.  He has been doing ok since last visit. He continues to work full-time. He is active, walking 45 minutes per day, 3-4 days per week with his wife. Mentions he is concerned about his blood sugar as his mom and uncle are diabetic. He is also concerned as several friends have recently had prostate issues. He sees a Pharmacist, community regularly. He watches his diet and tries to limit his salt and sugar intake. He is not on any specific diet. He has a BP cuff at home but doesn't check his BP.    He has several other concerns as well.   ED - Sx started about 10 yrs ago. He tried viagra originally which he thought helped but stopped it after a few months as he and his wife were not on the same page timing wise. They then were not sexually intimate for several years. They have returned to intimacy for past year, at  which time he began cialis which he has been taking daily. He thinks the cialis helps with his bph issues (see below) but does not give him an adequate erection. He has no concerns about a mental or intimacy aspect in regards to his ED.  BPH - Has had decreased flow and increased urgency for several years. Took flomax for kidney stone 1.5 yrs ago but has otherwise not been treated for this condition. Had a CT scan for stones 1.5 yrs ago which showed elevated prostate (5.5 cm transverse diameter). Also has documented enlarged prostate on cpe last year. He continues to have flow probs every morning as well as occasional urgency probs. He thinks that the cialis helps with the flow issues. Denies hematuria, former smoker with 7-8 ppy history.   Due for tdap, shingles, flu vaccines. Due for colonoscopy.    Review of Systems No CP, unusual SOB, fever, chills, dysuria, diarrhea, constipation, HA, abd pain.    Objective:   Physical Exam  Constitutional: He is oriented to person, place, and time. He appears well-developed and well-nourished.  Non-toxic appearance. He does not have a sickly appearance. He does not appear ill. No distress.  BP 138/80 mmHg  Pulse 70  Temp(Src) 98.1 F (36.7 C) (Oral)  Resp 16  Ht 5\' 8"  (1.727 m)  Wt 181 lb 9.6 oz (82.373 kg)  BMI 27.62 kg/m2  SpO2 99%   HENT:  Right Ear: Tympanic membrane normal.  Left Ear: Tympanic membrane normal.  Mouth/Throat: Uvula is midline, oropharynx is clear and moist and mucous membranes are normal. No oropharyngeal exudate, posterior oropharyngeal edema or posterior oropharyngeal erythema.  Eyes: Conjunctivae and EOM are normal. Pupils are equal, round, and reactive to light.  Neck: Normal range of motion. Carotid bruit is not present. No Brudzinski's sign noted. No thyroid mass and no thyromegaly present.  Cardiovascular: Normal rate, regular rhythm, S1 normal, S2 normal and normal heart sounds.  Exam reveals no gallop.   No murmur  heard. Pulses:      Radial pulses are 2+ on the right side, and 2+ on the left side.       Dorsalis pedis pulses are 2+ on the right side, and 2+ on the left side.       Posterior tibial pulses are 2+ on the right side, and 2+ on the left side.  Pulmonary/Chest: Effort normal and breath sounds normal. He has no decreased breath sounds. He has no wheezes. He has no rhonchi. He has no rales.  Genitourinary: Rectum normal. Rectal exam shows no tenderness. Prostate is enlarged. Prostate is not tender.  Prostate enlarged without tenderness. No nodules, masses, irregularity.   Lymphadenopathy:       Head (right side): No submental, no submandibular and no tonsillar adenopathy present.       Head (left side): No submental, no submandibular and no tonsillar adenopathy present.    He has no cervical adenopathy.  Neurological: He is alert and oriented to person, place, and time. He has normal strength. No cranial nerve deficit or sensory deficit. Coordination and gait normal.  Reflex Scores:      Patellar reflexes are 2+ on the right side and 2+ on the left side. Skin: Skin is warm and dry. No rash noted. No cyanosis. Nails show no clubbing.  Psychiatric: He has a normal mood and affect. His speech is normal.   Results for orders placed or performed in visit on 03/15/14  POCT glycosylated hemoglobin (Hb A1C)  Result Value Ref Range   Hemoglobin A1C 5.1   POCT urinalysis dipstick  Result Value Ref Range   Color, UA YELLOW    Clarity, UA CLEAR    Glucose, UA NEG    Bilirubin, UA NEG    Ketones, UA 15    Spec Grav, UA 1.015    Blood, UA NEG    pH, UA 6.0    Protein, UA NEG    Urobilinogen, UA 0.2    Nitrite, UA NEG    Leukocytes, UA Negative   POCT UA - Microscopic Only  Result Value Ref Range   WBC, Ur, HPF, POC NEG    RBC, urine, microscopic NEG    Bacteria, U Microscopic NEG    Mucus, UA NEG    Epithelial cells, urine per micros NEG    Crystals, Ur, HPF, POC NEG    Casts, Ur, LPF,  POC NEG    Yeast, UA NEG       Assessment & Plan:   39 yom presents today for annual CPE.  Annual physical exam --exam normal other than enlarge prostate  Screening for prostate cancer - Plan: PSA  Screening for deficiency anemia - Plan: CBC  Screening for diabetes mellitus - Plan: Comprehensive metabolic panel, POCT glycosylated hemoglobin (Hb A1C) --fam hx dm --a1c 5.2 today --continue exercise and limiting sugar  Screening for nephropathy -  Plan: Comprehensive metabolic panel, POCT urinalysis dipstick, POCT UA - Microscopic Only --blood in urine 1.5 yrs ago during kidney stone --recheck to ensure resolved as pt is african [place] with smoking hx --no hematuria today  Enlarged prostate - Plan: PSA, POCT urinalysis dipstick, POCT UA - Microscopic Only, finasteride (PROSCAR) 5 MG tablet --enlarged on ct sacn 1.5 yrs ago --enlarge today, no tenderness or nodules --finasteride for bph  Need for Tdap vaccination - Plan: Tdap vaccine greater than or equal to 7yo IM --tdap vaccine today, declines shingles and flu vaccines  Screening for hyperlipidemia - Plan: Lipid panel  Erectile dysfunction, unspecified erectile dysfunction type - Plan: sildenafil (VIAGRA) 100 MG tablet --cialis not working well --pt feels no intimacy or communication issues with wife at this time --switch to viagra prn, low bp se reviewed  Special screening for malignant neoplasms, colon - Plan: Ambulatory referral to Gastroenterology  Julieta Gutting, PA-C Physician Assistant-Certified [facility]  03/15/2014 11:57 AM

## 2014-03-15 NOTE — Patient Instructions (Signed)
We drew a lot of labs today. I will let you know the results of these labs in the next 1-2 days. Your A1C which looks at your blood sugar level was great today! Your urine sample was great, no blood in the urine. You received the tdap vaccine today. Your physical exam was normal today other than the enlarge prostate. Please start the finasteride once daily which will help to shrink the prostate. This can take several months to take effect. Please stop taking the cialis and start taking the viagra. You can take 1/2 pill or 1 whole pill as needed for an erection. Keep in mind it will take 30 minutes to kick in and last about 4 hours so you will need to be on the same page as your wife. You need to be careful with your blood pressure dropping too low with this medication. Be sure to stay hydrated and sit and stand slowly if you've taken a pill.  Please come back to clinic if these measures do not seem to be helping.

## 2014-03-16 LAB — COMPREHENSIVE METABOLIC PANEL
ALBUMIN: 4.4 g/dL (ref 3.5–5.2)
ALK PHOS: 55 U/L (ref 39–117)
ALT: 17 U/L (ref 0–53)
AST: 20 U/L (ref 0–37)
BUN: 18 mg/dL (ref 6–23)
CO2: 24 meq/L (ref 19–32)
Calcium: 10.2 mg/dL (ref 8.4–10.5)
Chloride: 106 mEq/L (ref 96–112)
Creat: 0.79 mg/dL (ref 0.50–1.35)
GLUCOSE: 82 mg/dL (ref 70–99)
POTASSIUM: 4.8 meq/L (ref 3.5–5.3)
SODIUM: 139 meq/L (ref 135–145)
TOTAL PROTEIN: 6.7 g/dL (ref 6.0–8.3)
Total Bilirubin: 0.6 mg/dL (ref 0.2–1.2)

## 2014-03-16 LAB — LIPID PANEL
CHOL/HDL RATIO: 3.3 ratio
Cholesterol: 160 mg/dL (ref 0–200)
HDL: 48 mg/dL (ref >39)
LDL CALC: 97 mg/dL (ref 0–99)
Triglycerides: 76 mg/dL (ref <150)
VLDL: 15 mg/dL (ref 0–40)

## 2014-03-16 LAB — PSA: PSA: 3.15 ng/mL (ref <=4.00)

## 2014-03-24 ENCOUNTER — Other Ambulatory Visit: Payer: Self-pay | Admitting: Emergency Medicine

## 2014-03-25 ENCOUNTER — Other Ambulatory Visit: Payer: Self-pay | Admitting: Emergency Medicine

## 2014-03-27 NOTE — Telephone Encounter (Signed)
Todd, since you just saw pt for CPE I am sending this to you for review.

## 2014-03-28 NOTE — Telephone Encounter (Signed)
Pt was changed to viagra at last appt as didn't think cialis was working.

## 2014-04-17 ENCOUNTER — Encounter: Payer: Self-pay | Admitting: Internal Medicine

## 2014-05-23 ENCOUNTER — Ambulatory Visit (INDEPENDENT_AMBULATORY_CARE_PROVIDER_SITE_OTHER): Payer: Federal, State, Local not specified - PPO | Admitting: Family Medicine

## 2014-05-23 ENCOUNTER — Encounter: Payer: Self-pay | Admitting: Family Medicine

## 2014-05-23 ENCOUNTER — Other Ambulatory Visit: Payer: Self-pay | Admitting: Family Medicine

## 2014-05-23 VITALS — BP 156/88 | HR 75 | Temp 98.7°F | Resp 16 | Ht 68.5 in | Wt 182.0 lb

## 2014-05-23 DIAGNOSIS — Z113 Encounter for screening for infections with a predominantly sexual mode of transmission: Secondary | ICD-10-CM

## 2014-05-23 DIAGNOSIS — L299 Pruritus, unspecified: Secondary | ICD-10-CM

## 2014-05-23 DIAGNOSIS — IMO0001 Reserved for inherently not codable concepts without codable children: Secondary | ICD-10-CM

## 2014-05-23 DIAGNOSIS — R21 Rash and other nonspecific skin eruption: Secondary | ICD-10-CM

## 2014-05-23 DIAGNOSIS — N400 Enlarged prostate without lower urinary tract symptoms: Secondary | ICD-10-CM

## 2014-05-23 DIAGNOSIS — Z531 Procedure and treatment not carried out because of patient's decision for reasons of belief and group pressure: Secondary | ICD-10-CM

## 2014-05-23 MED ORDER — TADALAFIL 5 MG PO TABS: ORAL_TABLET | ORAL | Status: DC

## 2014-05-23 NOTE — Progress Notes (Signed)
Subjective:    Patient ID: Leonard Bryant, male    DOB: 1950/07/03, 64 y.o.   MRN: [id number]  HPI This 64 y.o. Male is here to discuss medication recently prescribed for BPH. He has no symptoms of retention of difficulty urinating but has enlarged prostate gland on DRE. He also has mild ED; Viagra was prescribed but he could not afford it; Cialis has worked well in the past.  Proscar was prescribed in Dec 2015 by Araceli Bouche, PA-C; pt started the medication but stopped after reading about side effects; His concerns were related to cancer and decreased libido. Pt is agreeable to Urology evaluation.  Pt c/o generalized rash, onset on forearms about 4-6 weeks ago. It is pruritic but rash subsides if he does not scratch. Pt changed soap and laundry detergent but can see no significant difference. He does not have pets. He has no food allergies. He denies exposure to any noxious agents that could cause a rash or itching. STD risk is extremely low; pt is married and denies any other relationships that could put him at risk. Topical cocoa butter seems to help reduce itching.  Patient Active Problem List   Diagnosis Date Noted  . Refusal of blood transfusions as patient is Jehovah's Witness 05/23/2014  . BPH (benign prostatic hyperplasia) 03/15/2014  . Erectile dysfunction 03/15/2014  . Leukopenia 05/06/2012  . Allergic rhinitis 05/06/2012  . Hyperlipidemia     Prior to Admission medications   Medication Sig Start Date End Date Taking? Authorizing Provider  fluticasone (FLONASE) 50 MCG/ACT nasal spray PLACE 2 SPRAYS INTO BOTH NOSTRILS DAILY. 03/27/14  Yes Todd McVeigh, PA  ibuprofen (ADVIL,MOTRIN) 200 MG tablet Take 400 mg by mouth every 6 (six) hours as needed for pain.    Yes Historical Provider, MD  Multiple Vitamin (MULTIVITAMIN WITH MINERALS) TABS tablet Take 1 tablet by mouth every morning.   Yes Historical Provider, MD    Past Surgical History  Procedure Laterality Date  . Hernia repair        Femoral?    History   Social History  . Marital Status: Married    Spouse Name: N/A  . Number of Children: N/A  . Years of Education: N/A   Occupational History  . Not on file.   Social History Main Topics  . Smoking status: Former Smoker -- 1.50 packs/day for 5 years    Types: Cigarettes  . Smokeless tobacco: Not on file  . Alcohol Use: Yes     Comment: occasional  . Drug Use: No  . Sexual Activity: Not on file   Other Topics Concern  . Not on file   Social History Narrative    Family History  Problem Relation Age of Onset  . Diabetes Mother   . Heart disease Mother   . Stroke Mother     Review of Systems  Constitutional: Negative.   HENT: Negative.   Eyes: Negative.   Cardiovascular: Negative.   Musculoskeletal: Negative.   Skin: Positive for rash.  Neurological: Negative.   Psychiatric/Behavioral: Negative for sleep disturbance and dysphoric mood. The patient is nervous/anxious.   As per HPI.     Objective:   Physical Exam  Constitutional: He is oriented to person, place, and time. He appears well-developed and well-nourished. No distress.  HENT:  Head: Normocephalic and atraumatic.  Right Ear: External ear normal.  Left Ear: External ear normal.  Nose: Nose normal.  Mouth/Throat: Oropharynx is clear and moist.  Eyes: EOM are normal.  Pupils are equal, round, and reactive to light. No scleral icterus.  Cardiovascular: Normal rate and regular rhythm.   Pulmonary/Chest: Effort normal. No respiratory distress.  Musculoskeletal: Normal range of motion.  Neurological: He is alert and oriented to person, place, and time. No cranial nerve deficit. Coordination normal.  Skin: Skin is warm and intact. Rash noted. Rash is papular and urticarial. He is not diaphoretic. No cyanosis. Nails show no clubbing.  Forearms- hyperpigmented lesions; erythematous papules. Trunk- back and waist area w/ erythematous and raised lesions >> skin resembles skin of orange.  Back- ovoid hyperpigmented areas in mid-back (Christmas tree distribution).  Psychiatric: His speech is normal and behavior is normal. Thought content normal. His mood appears anxious. His affect is not labile and not inappropriate. Cognition and memory are normal. He does not exhibit a depressed mood.  Nursing note and vitals reviewed.      Assessment & Plan:  BPH without obstruction/lower urinary tract symptoms - Pt wants RX for Cialis for daily use. He will need to check w/ his insurer to see if it is covered. Plan: Ambulatory referral to Urology  Itching- OTC anti-histamine  (Benadryl or Claritin). Topical anti-itch lotion.  Rash and nonspecific skin eruption - Suspect Pityriasis rosea; print info given to pt about this rash.        Plan: RPR  Screening for STD (sexually transmitted disease) - Plan: HIV antibody, Hepatitis C Ab Reflex HCV RNA, QUANT, RPR  Refusal of blood transfusions as patient is Jehovah's Witness  Meds ordered this encounter  Medications  . tadalafil (CIALIS) 5 MG tablet    Sig: Take 1 tablet by mouth daily.    Dispense:  30 tablet    Refill:  2

## 2014-05-23 NOTE — Patient Instructions (Signed)
Pityriasis Rosea  Pityriasis rosea is a rash which is probably caused by a virus. It generally starts as a scaly, red patch on the trunk (the area of the body that a t-shirt would cover) but does not appear on sun exposed areas. The rash is usually preceded by an initial larger spot called the "herald patch" a week or more before the rest of the rash appears. Generally within one to two days the rash appears rapidly on the trunk, upper arms, and sometimes the upper legs. The rash usually appears as flat, oval patches of scaly pink color. The rash can also be raised and one is able to feel it with a finger. The rash can also be finely crinkled and may slough off leaving a ring of scale around the spot. Sometimes a mild sore throat is present with the rash. It usually affects children and young adults in the spring and autumn. Women are more frequently affected than men.  TREATMENT   Pityriasis rosea is a self-limited condition. This means it goes away within 4 to 8 weeks without treatment. The spots may persist for several months, especially in darker-colored skin after the rash has resolved and healed. Benadryl and steroid creams may be used if itching is a problem.  SEEK MEDICAL CARE IF:   · Your rash does not go away or persists longer than three months.  · You develop fever and joint pain.  · You develop severe headache and confusion.  · You develop breathing difficulty, vomiting and/or extreme weakness.  Document Released: 04/23/2001 Document Revised: 06/09/2011 Document Reviewed: 05/12/2008  ExitCare® Patient Information ©2015 [employer]. This information is not intended to replace advice given to you by your health care provider. Make sure you discuss any questions you have with your health care provider.

## 2014-05-24 LAB — HIV ANTIBODY (ROUTINE TESTING W REFLEX): HIV 1&2 Ab, 4th Generation: NONREACTIVE

## 2014-05-24 LAB — HEPATITIS C ANTIBODY: HCV Ab: NEGATIVE

## 2014-05-24 LAB — RPR

## 2015-01-20 ENCOUNTER — Emergency Department (HOSPITAL_COMMUNITY)
Admission: EM | Admit: 2015-01-20 | Discharge: 2015-01-20 | Disposition: A | Discharge status: Home / Self Care | Payer: Federal, State, Local not specified - PPO | Attending: Emergency Medicine | Admitting: Emergency Medicine

## 2015-01-20 ENCOUNTER — Ambulatory Visit (INDEPENDENT_AMBULATORY_CARE_PROVIDER_SITE_OTHER): Payer: Federal, State, Local not specified - PPO | Admitting: Emergency Medicine

## 2015-01-20 ENCOUNTER — Encounter (HOSPITAL_COMMUNITY): Payer: Self-pay | Admitting: Nurse Practitioner

## 2015-01-20 ENCOUNTER — Emergency Department (HOSPITAL_COMMUNITY): Payer: Federal, State, Local not specified - PPO

## 2015-01-20 VITALS — BP 120/90 | HR 75 | Temp 98.6°F | Resp 20 | Ht 69.0 in | Wt 178.2 lb

## 2015-01-20 DIAGNOSIS — Z87891 Personal history of nicotine dependence: Secondary | ICD-10-CM | POA: Diagnosis not present

## 2015-01-20 DIAGNOSIS — R079 Chest pain, unspecified: Secondary | ICD-10-CM | POA: Diagnosis present

## 2015-01-20 DIAGNOSIS — J40 Bronchitis, not specified as acute or chronic: Secondary | ICD-10-CM | POA: Insufficient documentation

## 2015-01-20 DIAGNOSIS — Z7951 Long term (current) use of inhaled steroids: Secondary | ICD-10-CM | POA: Insufficient documentation

## 2015-01-20 DIAGNOSIS — Z8639 Personal history of other endocrine, nutritional and metabolic disease: Secondary | ICD-10-CM | POA: Insufficient documentation

## 2015-01-20 DIAGNOSIS — R072 Precordial pain: Secondary | ICD-10-CM | POA: Diagnosis not present

## 2015-01-20 DIAGNOSIS — Z79899 Other long term (current) drug therapy: Secondary | ICD-10-CM | POA: Diagnosis not present

## 2015-01-20 DIAGNOSIS — M1990 Unspecified osteoarthritis, unspecified site: Secondary | ICD-10-CM | POA: Insufficient documentation

## 2015-01-20 DIAGNOSIS — Z880 Allergy status to penicillin: Secondary | ICD-10-CM | POA: Insufficient documentation

## 2015-01-20 DIAGNOSIS — R0602 Shortness of breath: Secondary | ICD-10-CM

## 2015-01-20 LAB — CBC
HCT: 41.2 % (ref 39.0–52.0)
Hemoglobin: 13.9 g/dL (ref 13.0–17.0)
MCH: 28.7 pg (ref 26.0–34.0)
MCHC: 33.7 g/dL (ref 30.0–36.0)
MCV: 85.1 fL (ref 78.0–100.0)
Platelets: 199 10*3/uL (ref 150–400)
RBC: 4.84 MIL/uL (ref 4.22–5.81)
RDW: 13.7 % (ref 11.5–15.5)
WBC: 3.4 10*3/uL — ABNORMAL LOW (ref 4.0–10.5)

## 2015-01-20 LAB — BASIC METABOLIC PANEL
ANION GAP: 6 (ref 5–15)
BUN: 16 mg/dL (ref 6–20)
CALCIUM: 9.7 mg/dL (ref 8.9–10.3)
CO2: 29 mmol/L (ref 22–32)
CREATININE: 0.99 mg/dL (ref 0.61–1.24)
Chloride: 103 mmol/L (ref 101–111)
GFR calc Af Amer: >60 mL/min (ref >60)
GLUCOSE: 87 mg/dL (ref 65–99)
Potassium: 4 mmol/L (ref 3.5–5.1)
Sodium: 138 mmol/L (ref 135–145)

## 2015-01-20 LAB — I-STAT TROPONIN, ED: TROPONIN I, POC: 0 ng/mL (ref 0.00–0.08)

## 2015-01-20 MED ORDER — ALBUTEROL SULFATE HFA 108 (90 BASE) MCG/ACT IN AERS
2.0000 | INHALATION_SPRAY | Freq: Once | RESPIRATORY_TRACT | Status: AC
  Administered 2015-01-20: 2 via RESPIRATORY_TRACT
  Filled 2015-01-20: qty 6.7

## 2015-01-20 NOTE — Discharge Instructions (Signed)
1. Medications: albuterol inhaler, usual home medications 2. Treatment: rest, drink plenty of fluids 3. Follow Up: please followup with your primary doctor for discussion of your diagnoses and further evaluation after today's visit; please return to the ER for chest pain, shortness of breath, high fever, new or worsening symptoms   How to Use an Inhaler Proper inhaler technique is very important. Good technique ensures that the medicine reaches the lungs. Poor technique results in depositing the medicine on the tongue and back of the throat rather than in the airways. If you do not use the inhaler with good technique, the medicine will not help you. STEPS TO FOLLOW IF USING AN INHALER WITHOUT AN EXTENSION TUBE  Remove the cap from the inhaler.  If you are using the inhaler for the first time, you will need to prime it. Shake the inhaler for 5 seconds and release four puffs into the air, away from your face. Ask your health care provider or pharmacist if you have questions about priming your inhaler.  Shake the inhaler for 5 seconds before each breath in (inhalation).  Position the inhaler so that the top of the canister faces up.  Put your index finger on the top of the medicine canister. Your thumb supports the bottom of the inhaler.  Open your mouth.  Either place the inhaler between your teeth and place your lips tightly around the mouthpiece, or hold the inhaler 1-2 inches away from your open mouth. If you are unsure of which technique to use, ask your health care provider.  Breathe out (exhale) normally and as completely as possible.  Press the canister down with your index finger to release the medicine.  At the same time as the canister is pressed, inhale deeply and slowly until your lungs are completely filled. This should take 4-6 seconds. Keep your tongue down.  Hold the medicine in your lungs for 5-10 seconds (10 seconds is best). This helps the medicine get into the small  airways of your lungs.  Breathe out slowly, through pursed lips. Whistling is an example of pursed lips.  Wait at least 15-30 seconds between puffs. Continue with the above steps until you have taken the number of puffs your health care provider has ordered. Do not use the inhaler more than your health care provider tells you.  Replace the cap on the inhaler.  Follow the directions from your health care provider or the inhaler insert for cleaning the inhaler. STEPS TO FOLLOW IF USING AN INHALER WITH AN EXTENSION (SPACER)  Remove the cap from the inhaler.  If you are using the inhaler for the first time, you will need to prime it. Shake the inhaler for 5 seconds and release four puffs into the air, away from your face. Ask your health care provider or pharmacist if you have questions about priming your inhaler.  Shake the inhaler for 5 seconds before each breath in (inhalation).  Place the open end of the spacer onto the mouthpiece of the inhaler.  Position the inhaler so that the top of the canister faces up and the spacer mouthpiece faces you.  Put your index finger on the top of the medicine canister. Your thumb supports the bottom of the inhaler and the spacer.  Breathe out (exhale) normally and as completely as possible.  Immediately after exhaling, place the spacer between your teeth and into your mouth. Close your lips tightly around the spacer.  Press the canister down with your index finger to release  the medicine.  At the same time as the canister is pressed, inhale deeply and slowly until your lungs are completely filled. This should take 4-6 seconds. Keep your tongue down and out of the way.  Hold the medicine in your lungs for 5-10 seconds (10 seconds is best). This helps the medicine get into the small airways of your lungs. Exhale.  Repeat inhaling deeply through the spacer mouthpiece. Again hold that breath for up to 10 seconds (10 seconds is best). Exhale slowly. If  it is difficult to take this second deep breath through the spacer, breathe normally several times through the spacer. Remove the spacer from your mouth.  Wait at least 15-30 seconds between puffs. Continue with the above steps until you have taken the number of puffs your health care provider has ordered. Do not use the inhaler more than your health care provider tells you.  Remove the spacer from the inhaler, and place the cap on the inhaler.  Follow the directions from your health care provider or the inhaler insert for cleaning the inhaler and spacer. If you are using different kinds of inhalers, use your quick relief medicine to open the airways 10-15 minutes before using a steroid if instructed to do so by your health care provider. If you are unsure which inhalers to use and the order of using them, ask your health care provider, nurse, or respiratory therapist. If you are using a steroid inhaler, always rinse your mouth with water after your last puff, then gargle and spit out the water. Do not swallow the water. AVOID:  Inhaling before or after starting the spray of medicine. It takes practice to coordinate your breathing with triggering the spray.  Inhaling through the nose (rather than the mouth) when triggering the spray. HOW TO DETERMINE IF YOUR INHALER IS FULL OR NEARLY EMPTY You cannot know when an inhaler is empty by shaking it. A few inhalers are now being made with dose counters. Ask your health care provider for a prescription that has a dose counter if you feel you need that extra help. If your inhaler does not have a counter, ask your health care provider to help you determine the date you need to refill your inhaler. Write the refill date on a calendar or your inhaler canister. Refill your inhaler 7-10 days before it runs out. Be sure to keep an adequate supply of medicine. This includes making sure it is not expired, and that you have a spare inhaler.  SEEK MEDICAL CARE IF:    Your symptoms are only partially relieved with your inhaler.  You are having trouble using your inhaler.  You have some increase in phlegm. SEEK IMMEDIATE MEDICAL CARE IF:   You feel little or no relief with your inhalers. You are still wheezing and are feeling shortness of breath or tightness in your chest or both.  You have dizziness, headaches, or a fast heart rate.  You have chills, fever, or night sweats.  You have a noticeable increase in phlegm production, or there is blood in the phlegm. MAKE SURE YOU:   Understand these instructions.  Will watch your condition.  Will get help right away if you are not doing well or get worse.   This information is not intended to replace advice given to you by your health care provider. Make sure you discuss any questions you have with your health care provider.   Document Released: 03/14/2000 Document Revised: 01/05/2013 Document Reviewed: 10/14/2012 Elsevier Interactive Patient  Education 2016 [employer].  Shortness of Breath Shortness of breath means you have trouble breathing. It could also mean that you have a medical problem. You should get immediate medical care for shortness of breath. CAUSES   Not enough oxygen in the air such as with high altitudes or a smoke-filled room.  Certain lung diseases, infections, or problems.  Heart disease or conditions, such as angina or heart failure.  Low red blood cells (anemia).  Poor physical fitness, which can cause shortness of breath when you exercise.  Chest or back injuries or stiffness.  Being overweight.  Smoking.  Anxiety, which can make you feel like you are not getting enough air. DIAGNOSIS  Serious medical problems can often be found during your physical exam. Tests may also be done to determine why you are having shortness of breath. Tests may include:  Chest X-rays.  Lung function tests.  Blood tests.  An electrocardiogram (ECG).  An ambulatory  electrocardiogram. An ambulatory ECG records your heartbeat patterns over a 24-hour period.  Exercise testing.  A transthoracic echocardiogram (TTE). During echocardiography, sound waves are used to evaluate how blood flows through your heart.  A transesophageal echocardiogram (TEE).  Imaging scans. Your health care provider may not be able to find a cause for your shortness of breath after your exam. In this case, it is important to have a follow-up exam with your health care provider as directed.  TREATMENT  Treatment for shortness of breath depends on the cause of your symptoms and can vary greatly. HOME CARE INSTRUCTIONS   Do not smoke. Smoking is a common cause of shortness of breath. If you smoke, ask for help to quit.  Avoid being around chemicals or things that may bother your breathing, such as paint fumes and dust.  Rest as needed. Slowly resume your usual activities.  If medicines were prescribed, take them as directed for the full length of time directed. This includes oxygen and any inhaled medicines.  Keep all follow-up appointments as directed by your health care provider. SEEK MEDICAL CARE IF:   Your condition does not improve in the time expected.  You have a hard time doing your normal activities even with rest.  You have any new symptoms. SEEK IMMEDIATE MEDICAL CARE IF:   Your shortness of breath gets worse.  You feel light-headed, faint, or develop a cough not controlled with medicines.  You start coughing up blood.  You have pain with breathing.  You have chest pain or pain in your arms, shoulders, or abdomen.  You have a fever.  You are unable to walk up stairs or exercise the way you normally do. MAKE SURE YOU:  Understand these instructions.  Will watch your condition.  Will get help right away if you are not doing well or get worse.   This information is not intended to replace advice given to you by your health care provider. Make sure  you discuss any questions you have with your health care provider.   Document Released: 12/10/2000 Document Revised: 03/22/2013 Document Reviewed: 06/02/2011 Elsevier Interactive Patient Education [employer].

## 2015-01-20 NOTE — ED Provider Notes (Signed)
CSN: [id number]     Arrival date & time 01/20/15  1158 History   First MD Initiated Contact with Patient 01/20/15 1216     Chief Complaint  Patient presents with  . Chest Pain     HPI   Leonard Bryant is a 64 y.o. male with a PMH of allergies, arthritis who presents to the ED with shortness of breath. He states he ate dinner late last night, and tried to go to sleep, but "could not get comfortable" due to shortness of breath and chest tightness. He reports he sat up and took several deep breaths, which relieved his symptoms. He states this occurred intermittently from around 1:30 AM to 3:00 AM. He states he developed a cramp in his right thigh, ate a spoonful of mustard, and his symptoms resolved. He reports he turned the heat on in his house at that time, went to sleep, and had no symptom recurrence. He was seen in urgent care this morning, and was subsequently sent to the ED. He denies fever, chills, headache, lightheadedness, dizziness, chest pain. He reports his chest "felt tight" with his shortness of breath. He also reports nasal congestion, which he attributes to allergies. He denies cough, abdominal pain, nausea, vomiting, diarrhea, constipation. He denies recent travel or immobility, recent surgery, history of malignancy, tobacco use, lower extremity swelling.   Past Medical History  Diagnosis Date  . Allergy     congestion  . 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 Hyperlipidemia     Was borderline in the past   Past Surgical History  Procedure Laterality Date  . Hernia repair      Femoral?   Family History  Problem Relation Age of Onset  . Diabetes Mother   . Heart disease Mother   . Stroke Mother    Social History  Substance Use Topics  . Smoking status: Former Smoker -- 1.50 packs/day for 5 years    Types: Cigarettes  . Smokeless tobacco: None  . Alcohol Use: Yes     Comment: occasional      Review of Systems  Constitutional: Negative for fever and chills.   HENT: Positive for congestion.   Respiratory: Positive for chest tightness and shortness of breath. Negative for cough.   Cardiovascular: Negative for chest pain.  Gastrointestinal: Negative for nausea, vomiting, abdominal pain, diarrhea and constipation.  Neurological: Negative for dizziness, syncope, light-headedness and headaches.  All other systems reviewed and are negative.     Allergies  Pseudoephedrine and Penicillins  Home Medications   Prior to Admission medications   Medication Sig Start Date End Date Taking? Authorizing Provider  fluticasone (FLONASE) 50 MCG/ACT nasal spray PLACE 2 SPRAYS INTO BOTH NOSTRILS DAILY. 03/27/14   Araceli Bouche, PA  ibuprofen (ADVIL,MOTRIN) 200 MG tablet Take 400 mg by mouth every 6 (six) hours as needed for pain.     Historical Provider, MD  Multiple Vitamin (MULTIVITAMIN WITH MINERALS) TABS tablet Take 1 tablet by mouth every morning.    Historical Provider, MD  tadalafil (CIALIS) 5 MG tablet Take 1 tablet by mouth daily. 05/23/14   Barton Fanny, MD    BP 155/89 mmHg  Pulse 58  Temp(Src) 97.9 F (36.6 C) (Oral)  Resp 17  SpO2 100% Physical Exam  Constitutional: He is oriented to person, place, and time. He appears well-developed and well-nourished. No distress.  HENT:  Head: Normocephalic and atraumatic.  Right Ear: External ear normal.  Left Ear: External ear  normal.  Nose: Nose normal.  Mouth/Throat: Uvula is midline, oropharynx is clear and moist and mucous membranes are normal.  Eyes: Conjunctivae, EOM and lids are normal. Pupils are equal, round, and reactive to light. Right eye exhibits no discharge. Left eye exhibits no discharge. No scleral icterus.  Neck: Normal range of motion. Neck supple.  Cardiovascular: Normal rate, regular rhythm, normal heart sounds, intact distal pulses and normal pulses.   Pulmonary/Chest: Effort normal and breath sounds normal. No respiratory distress. He has no wheezes. He has no rales.   Abdominal: Soft. Normal appearance and bowel sounds are normal. He exhibits no distension and no mass. There is no tenderness. There is no rigidity, no rebound and no guarding.  Musculoskeletal: Normal range of motion. He exhibits no edema or tenderness.  Neurological: He is alert and oriented to person, place, and time.  Skin: Skin is warm, dry and intact. No rash noted. He is not diaphoretic. No erythema. No pallor.  Psychiatric: He has a normal mood and affect. His speech is normal and behavior is normal.  Nursing note and vitals reviewed.   ED Course  Procedures (including critical care time)  Labs Review Labs Reviewed  CBC - Abnormal; Notable for the following:    WBC 3.4 (*)    All other components within normal limits  BASIC METABOLIC PANEL  I-STAT TROPOININ, ED    Imaging Review Dg Chest 2 View  01/20/2015  CLINICAL DATA:  Central chest pressure and shortness of breath last night, worked with asbestos for 4 years, former smoker EXAM: CHEST  2 VIEW COMPARISON:  None FINDINGS: Normal heart size, mediastinal contours, and pulmonary vascularity. Minimal peribronchial thickening and hyperinflation. No pulmonary infiltrate, pleural effusion or pneumothorax. Bones unremarkable. IMPRESSION: Minimal bronchitic changes without acute infiltrate. Electronically Signed   By: Lavonia Dana M.D.   On: 01/20/2015 13:00   I have personally reviewed and evaluated these images and lab results as part of my medical decision-making.   EKG Interpretation   Date/Time:  Saturday January 20 2015 12:03:07 EDT Ventricular Rate:  63 PR Interval:  158 QRS Duration: 90 QT Interval:  400 QTC Calculation: 409 R Axis:   51 Text Interpretation:  Sinus rhythm with Premature atrial complexes new   Nonspecific T wave abnormality Confirmed by Maryan Rued  MD, Loree Fee ([id number])  on 01/20/2015 12:15:08 PM      MDM   Final diagnoses:  Bronchitis  Shortness of breath    64 year old male presents with  shortness of breath and chest tightness. He states he experienced these symptoms intermittently from 1:30 AM to 3 AM last night, and has not had recurrence of symptoms since that time. Denies fever, chills, headache, lightheadedness, dizziness, chest pain. Reports nasal congestion, which he attributes to allergies. Denies cough, abdominal pain, nausea, vomiting, diarrhea, constipation. Denies recent travel or immobility, recent surgery, history of malignancy, tobacco use, lower extremity swelling.  Patient is afebrile. Vital signs stable. Heart regular rate and rhythm. Lungs clear to auscultation bilaterally. Abdomen soft, nontender, nondistended. No lower extremity edema.  EKG sinus rhythm with PACs, no acute ischemia. Troponin negative. HEART score 1 given age. CBC negative for leukocytosis. BMP within normal limits. Chest x-ray shows normal bronchitic changes without acute infiltrate.  Discussed findings with patient. Doubt ACS given negative work-up in the ED. Low suspicion for PE. Will give albuterol inhaler for symptom relief. Patient is well-appearing, feel he is stable for discharge at this time. Patient to follow up with PCP. Return precautions discussed  at length. Patient verbalizes understanding and is in agreement with plan.  BP 144/80 mmHg  Pulse 58  Temp(Src) 97.9 F (36.6 C) (Oral)  Resp 19  SpO2 100%   Marella Chimes, PA-C 01/20/15 1453  Blanchie Dessert, MD 01/20/15 1525

## 2015-01-20 NOTE — ED Notes (Signed)
Pt reports he ate a late dinner last night then after he felt he couldn't breathe in normally and uncomfortable sensation in chest when he was lying down to go to bed. Symptoms lasted about 10 minutes and resolved after he sat up. He woke with a simlar episode about 2 am that resolved and he went back to sleep. He went to [facility] this am and they sent him for abnormal ekg. He states he feels fine today and denies any complaints. A&Ox4, resp e/u

## 2015-01-20 NOTE — ED Notes (Signed)
Error in validating SpO2 of 80% - SpO2 is 100% RA

## 2015-01-20 NOTE — Progress Notes (Signed)
Subjective:  Patient ID: Leonard Bryant, male    DOB: Jul 28, 1950  Age: 64 y.o. MRN: [id number]  CC: Shortness of Breath and Abdominal Pain   HPI CAEDAN SUMLER presents  patient has numerous complaints most striking include what he describes as a severe chest pressure that awakened him from sleep last night was associated with shortness of breath. He denies any radiation of that discomfort. He said he was up for approximately an hour and went back to sleep and when he laid back down he got symptomatic again. He was up 3 times last night and is currently asymptomatic. He has no cardiac risk factors other than age. He is nonsmoker has no high blood pressure diabetes or high cholesterol.  He complains of chronic constipation and has had a colonoscopy in the past. Says he is overdue for another. He denies any blood mucus or pus in stools not taking any medication for the constipation the tries increased fiber but is not successful usually with that.  He has a long history of right low back pain not radiating and not associated with any acute injury.  History Ahnaf has a past medical history of Allergy; Arthritis; and Hyperlipidemia.   He has past surgical history that includes Hernia repair.   His  family history includes Diabetes in his mother; Heart disease in his mother; Stroke in his mother.  He   reports that he has quit smoking. His smoking use included Cigarettes. He has a 7.5 pack-year smoking history. He does not have any smokeless tobacco history on file. He reports that he drinks alcohol. He reports that he does not use illicit drugs.  Outpatient Prescriptions Prior to Visit  Medication Sig Dispense Refill  . fluticasone (FLONASE) 50 MCG/ACT nasal spray PLACE 2 SPRAYS INTO BOTH NOSTRILS DAILY. 48 g 3  . ibuprofen (ADVIL,MOTRIN) 200 MG tablet Take 400 mg by mouth every 6 (six) hours as needed for pain.     . Multiple Vitamin (MULTIVITAMIN WITH MINERALS) TABS tablet Take 1 tablet by mouth  every morning.    . tadalafil (CIALIS) 5 MG tablet Take 1 tablet by mouth daily. 30 tablet 2   No facility-administered medications prior to visit.    Social History   Social History  . Marital Status: Married    Spouse Name: N/A  . Number of Children: N/A  . Years of Education: N/A   Social History Main Topics  . Smoking status: Former Smoker -- 1.50 packs/day for 5 years    Types: Cigarettes  . Smokeless tobacco: None  . Alcohol Use: Yes     Comment: occasional  . Drug Use: No  . Sexual Activity: Not Asked   Other Topics Concern  . None   Social History Narrative     Review of Systems  Constitutional: Negative for fever, chills and appetite change.  HENT: Negative for congestion, ear pain, postnasal drip, sinus pressure and sore throat.   Eyes: Negative for pain and redness.  Respiratory: Positive for chest tightness and shortness of breath. Negative for cough and wheezing.   Cardiovascular: Positive for chest pain. Negative for palpitations and leg swelling.  Gastrointestinal: Positive for constipation. Negative for nausea, vomiting, abdominal pain, diarrhea and blood in stool.  Endocrine: Negative for polyuria.  Genitourinary: Negative for dysuria, urgency, frequency and flank pain.  Musculoskeletal: Positive for back pain. Negative for gait problem.  Skin: Negative for rash.  Neurological: Negative for weakness and headaches.  Psychiatric/Behavioral: Negative for confusion and  decreased concentration. The patient is not nervous/anxious.     Objective:  BP 120/90 mmHg  Pulse 75  Temp(Src) 98.6 F (37 C) (Oral)  Resp 20  Ht 5\' 9"  (1.753 m)  Wt 178 lb 3.2 oz (80.831 kg)  BMI 26.30 kg/m2  SpO2 95%  Physical Exam  Constitutional: He is oriented to person, place, and time. He appears well-developed and well-nourished. No distress.  HENT:  Head: Normocephalic and atraumatic.  Right Ear: External ear normal.  Left Ear: External ear normal.  Nose: Nose  normal.  Eyes: Conjunctivae and EOM are normal. Pupils are equal, round, and reactive to light. No scleral icterus.  Neck: Normal range of motion. Neck supple. No tracheal deviation present.  Cardiovascular: Normal rate, regular rhythm and normal heart sounds.   Pulmonary/Chest: Effort normal. No respiratory distress. He has no wheezes. He has no rales.  Abdominal: He exhibits no mass. There is no tenderness. There is no rebound and no guarding.  Musculoskeletal: He exhibits no edema.  Lymphadenopathy:    He has no cervical adenopathy.  Neurological: He is alert and oriented to person, place, and time. Coordination normal.  Skin: Skin is warm and dry. No rash noted.  Psychiatric: He has a normal mood and affect. His behavior is normal.      Assessment & Plan:   Floyde was seen today for shortness of breath and abdominal pain.  Diagnoses and all orders for this visit:  Precordial pain -     EKG 12-Lead -     Cancel: Comprehensive metabolic panel -     Cancel: CBC   I am having Mr. Rosenfield maintain his ibuprofen, multivitamin with minerals, fluticasone, and tadalafil.  No orders of the defined types were placed in this encounter.   I discussed his chest pressure with him suggested that he be transferred to the emergency room by ambulance refused. Said that he drive himself. He really downplayed the risk of cardiovascular issue despite my admonitions to be cautious. I advised him that should he not be promptly seek suffer long-term disability or death and he certainly understood that risky refused to sign AMA  opriate red flag conditions were discussed with the patient as well as actions that should be taken.  Patient expressed his understanding.  Follow-up: Return if symptoms worsen or fail to improve.  Roselee Culver, MD

## 2015-01-20 NOTE — Patient Instructions (Signed)
Nonspecific Chest Pain  °Chest pain can be caused by many different conditions. There is always a chance that your pain could be related to something serious, such as a heart attack or a blood clot in your lungs. Chest pain can also be caused by conditions that are not life-threatening. If you have chest pain, it is very important to follow up with your health care provider. °CAUSES  °Chest pain can be caused by: °· Heartburn. °· Pneumonia or bronchitis. °· Anxiety or stress. °· Inflammation around your heart (pericarditis) or lung (pleuritis or pleurisy). °· A blood clot in your lung. °· A collapsed lung (pneumothorax). It can develop suddenly on its own (spontaneous pneumothorax) or from trauma to the chest. °· Shingles infection (varicella-zoster virus). °· Heart attack. °· Damage to the bones, muscles, and cartilage that make up your chest wall. This can include: °¨ Bruised bones due to injury. °¨ Strained muscles or cartilage due to frequent or repeated coughing or overwork. °¨ Fracture to one or more ribs. °¨ Sore cartilage due to inflammation (costochondritis). °RISK FACTORS  °Risk factors for chest pain may include: °· Activities that increase your risk for trauma or injury to your chest. °· Respiratory infections or conditions that cause frequent coughing. °· Medical conditions or overeating that can cause heartburn. °· Heart disease or family history of heart disease. °· Conditions or health behaviors that increase your risk of developing a blood clot. °· Having had chicken pox (varicella zoster). °SIGNS AND SYMPTOMS °Chest pain can feel like: °· Burning or tingling on the surface of your chest or deep in your chest. °· Crushing, pressure, aching, or squeezing pain. °· Dull or sharp pain that is worse when you move, cough, or take a deep breath. °· Pain that is also felt in your back, neck, shoulder, or arm, or pain that spreads to any of these areas. °Your chest pain may come and go, or it may stay  constant. °DIAGNOSIS °Lab tests or other studies may be needed to find the cause of your pain. Your health care provider may have you take a test called an ambulatory ECG (electrocardiogram). An ECG records your heartbeat patterns at the time the test is performed. You may also have other tests, such as: °· Transthoracic echocardiogram (TTE). During echocardiography, sound waves are used to create a picture of all of the heart structures and to look at how blood flows through your heart. °· Transesophageal echocardiogram (TEE). This is a more advanced imaging test that obtains images from inside your body. It allows your health care provider to see your heart in finer detail. °· Cardiac monitoring. This allows your health care provider to monitor your heart rate and rhythm in real time. °· Holter monitor. This is a portable device that records your heartbeat and can help to diagnose abnormal heartbeats. It allows your health care provider to track your heart activity for several days, if needed. °· Stress tests. These can be done through exercise or by taking medicine that makes your heart beat more quickly. °· Blood tests. °· Imaging tests. °TREATMENT  °Your treatment depends on what is causing your chest pain. Treatment may include: °· Medicines. These may include: °¨ Acid blockers for heartburn. °¨ Anti-inflammatory medicine. °¨ Pain medicine for inflammatory conditions. °¨ Antibiotic medicine, if an infection is present. °¨ Medicines to dissolve blood clots. °¨ Medicines to treat coronary artery disease. °· Supportive care for conditions that do not require medicines. This may include: °¨ Resting. °¨ Applying heat   or cold packs to injured areas. °¨ Limiting activities until pain decreases. °HOME CARE INSTRUCTIONS °· If you were prescribed an antibiotic medicine, finish it all even if you start to feel better. °· Avoid any activities that bring on chest pain. °· Do not use any tobacco products, including  cigarettes, chewing tobacco, or electronic cigarettes. If you need help quitting, ask your health care provider. °· Do not drink alcohol. °· Take medicines only as directed by your health care provider. °· Keep all follow-up visits as directed by your health care provider. This is important. This includes any further testing if your chest pain does not go away. °· If heartburn is the cause for your chest pain, you may be told to keep your head raised (elevated) while sleeping. This reduces the chance that acid will go from your stomach into your esophagus. °· Make lifestyle changes as directed by your health care provider. These may include: °¨ Getting regular exercise. Ask your health care provider to suggest some activities that are safe for you. °¨ Eating a heart-healthy diet. A registered dietitian can help you to learn healthy eating options. °¨ Maintaining a healthy weight. °¨ Managing diabetes, if necessary. °¨ Reducing stress. °SEEK MEDICAL CARE IF: °· Your chest pain does not go away after treatment. °· You have a rash with blisters on your chest. °· You have a fever. °SEEK IMMEDIATE MEDICAL CARE IF:  °· Your chest pain is worse. °· You have an increasing cough, or you cough up blood. °· You have severe abdominal pain. °· You have severe weakness. °· You faint. °· You have chills. °· You have sudden, unexplained chest discomfort. °· You have sudden, unexplained discomfort in your arms, back, neck, or jaw. °· You have shortness of breath at any time. °· You suddenly start to sweat, or your skin gets clammy. °· You feel nauseous or you vomit. °· You suddenly feel light-headed or dizzy. °· Your heart begins to beat quickly, or it feels like it is skipping beats. °These symptoms may represent a serious problem that is an emergency. Do not wait to see if the symptoms will go away. Get medical help right away. Call your local emergency services (911 in the [place] Do not drive yourself to the hospital. °  °This  information is not intended to replace advice given to you by your health care provider. Make sure you discuss any questions you have with your health care provider. °  °Document Released: 12/25/2004 Document Revised: 04/07/2014 Document Reviewed: 10/21/2013 °Elsevier Interactive Patient Education ©2016 [employer] ° °

## 2015-05-03 ENCOUNTER — Other Ambulatory Visit: Payer: Self-pay | Admitting: Physician Assistant

## 2016-01-02 ENCOUNTER — Ambulatory Visit (INDEPENDENT_AMBULATORY_CARE_PROVIDER_SITE_OTHER): Payer: Federal, State, Local not specified - PPO | Admitting: Physician Assistant

## 2016-01-02 VITALS — BP 132/84 | HR 74 | Temp 98.2°F | Resp 18 | Ht 68.0 in | Wt 174.0 lb

## 2016-01-02 DIAGNOSIS — Z130 Encounter for screening for diseases of the blood and blood-forming organs and certain disorders involving the immune mechanism: Secondary | ICD-10-CM | POA: Diagnosis not present

## 2016-01-02 DIAGNOSIS — Z13228 Encounter for screening for other metabolic disorders: Secondary | ICD-10-CM

## 2016-01-02 DIAGNOSIS — Z0000 Encounter for general adult medical examination without abnormal findings: Secondary | ICD-10-CM | POA: Diagnosis not present

## 2016-01-02 DIAGNOSIS — Z1329 Encounter for screening for other suspected endocrine disorder: Secondary | ICD-10-CM | POA: Diagnosis not present

## 2016-01-02 DIAGNOSIS — Z131 Encounter for screening for diabetes mellitus: Secondary | ICD-10-CM

## 2016-01-02 DIAGNOSIS — Z125 Encounter for screening for malignant neoplasm of prostate: Secondary | ICD-10-CM | POA: Diagnosis not present

## 2016-01-02 LAB — COMPLETE METABOLIC PANEL WITH GFR
ALBUMIN: 4.6 g/dL (ref 3.6–5.1)
ALK PHOS: 41 U/L (ref 40–115)
ALT: 18 U/L (ref 9–46)
AST: 22 U/L (ref 10–35)
BUN: 20 mg/dL (ref 7–25)
CHLORIDE: 109 mmol/L (ref 98–110)
CO2: 26 mmol/L (ref 20–31)
Calcium: 10.1 mg/dL (ref 8.6–10.3)
Creat: 0.96 mg/dL (ref 0.70–1.25)
GFR, Est African American: >89 mL/min (ref >=60)
GFR, Est Non African American: 84 mL/min (ref >=60)
GLUCOSE: 99 mg/dL (ref 65–99)
POTASSIUM: 4.6 mmol/L (ref 3.5–5.3)
SODIUM: 141 mmol/L (ref 135–146)
Total Bilirubin: 0.6 mg/dL (ref 0.2–1.2)
Total Protein: 6.7 g/dL (ref 6.1–8.1)

## 2016-01-02 LAB — CBC
HEMATOCRIT: 41.2 % (ref 38.5–50.0)
HEMOGLOBIN: 14.6 g/dL (ref 13.2–17.1)
MCH: 28.9 pg (ref 27.0–33.0)
MCHC: 35.4 g/dL (ref 32.0–36.0)
MCV: 81.6 fL (ref 80.0–100.0)
MPV: 10.1 fL (ref 7.5–12.5)
Platelets: 197 10*3/uL (ref 140–400)
RBC: 5.05 MIL/uL (ref 4.20–5.80)
RDW: 13.3 % (ref 11.0–15.0)
WBC: 2.8 10*3/uL — ABNORMAL LOW (ref 3.8–10.8)

## 2016-01-02 LAB — PSA: PSA: 2 ng/mL (ref <=4.0)

## 2016-01-02 LAB — TSH: TSH: 1.59 mIU/L (ref 0.40–4.50)

## 2016-01-02 NOTE — Patient Instructions (Addendum)
I will have your labs within 2 weeks. Please increase your hydration to 64 oz or more. Keeping you healthy  Get these tests  Blood pressure- Have your blood pressure checked once a year by your healthcare provider.  Normal blood pressure is 120/80  Weight- Have your body mass index (BMI) calculated to screen for obesity.  BMI is a measure of body fat based on height and weight. You can also calculate your own BMI at ViewBanking.si.  Cholesterol- Have your cholesterol checked every year.  Diabetes- Have your blood sugar checked regularly if you have high blood pressure, high cholesterol, have a family history of diabetes or if you are overweight.  Screening for Colon Cancer- Colonoscopy starting at age 8.  Screening may begin sooner depending on your family history and other health conditions. Follow up colonoscopy as directed by your Gastroenterologist.  Screening for Prostate Cancer- Both blood work (PSA) and a rectal exam help screen for Prostate Cancer.  Screening begins at age 93 with African-American men and at age 25 with Caucasian men.  Screening may begin sooner depending on your family history.  Take these medicines  Aspirin- One aspirin daily can help prevent Heart disease and Stroke.  Flu shot- Every fall.  Tetanus- Every 10 years.  Zostavax- Once after the age of 77 to prevent Shingles.  Pneumonia shot- Once after the age of 50; if you are younger than 24, ask your healthcare provider if you need a Pneumonia shot.  Take these steps  Don't smoke- If you do smoke, talk to your doctor about quitting.  For tips on how to quit, go to www.smokefree.gov or call 1-800-QUIT-NOW.  Be physically active- Exercise 5 days a week for at least 30 minutes.  If you are not already physically active start slow and gradually work up to 30 minutes of moderate physical activity.  Examples of moderate activity include walking briskly, mowing the yard, dancing, swimming, bicycling,  etc.  Eat a healthy diet- Eat a variety of healthy food such as fruits, vegetables, low fat milk, low fat cheese, yogurt, lean meant, poultry, fish, beans, tofu, etc. For more information go to www.thenutritionsource.org  Drink alcohol in moderation- Limit alcohol intake to less than two drinks a day. Never drink and drive.  Dentist- Brush and floss twice daily; visit your dentist twice a year.  Depression- Your emotional health is as important as your physical health. If you're feeling down, or losing interest in things you would normally enjoy please talk to your healthcare provider.  Eye exam- Visit your eye doctor every year.  Safe sex- If you may be exposed to a sexually transmitted infection, use a condom.  Seat belts- Seat belts can save your life; always wear one.  Smoke/Carbon Monoxide detectors- These detectors need to be installed on the appropriate level of your home.  Replace batteries at least once a year.  Skin cancer- When out in the sun, cover up and use sunscreen 15 SPF or higher.  Violence- If anyone is threatening you, please tell your healthcare provider.  Living Will/ Health care power of attorney- Speak with your healthcare provider and family.    IF you received an x-ray today, you will receive an invoice from [facility]. Please contact [facility] at [phone] with questions or concerns regarding your invoice.   IF you received labwork today, you will receive an invoice from [employer]. Please contact [facility] at [phone] with questions or concerns regarding your invoice.   Our  billing staff will not be able to assist you with questions regarding bills from these companies.  You will be contacted with the lab results as soon as they are available. The fastest way to get your results is to activate your My Chart account. Instructions are located on the last page of this paperwork. If you have not heard from  [place] regarding the results in 2 weeks, please contact this office.

## 2016-01-02 NOTE — Progress Notes (Signed)
[facility] [address] [place] [address] [phone]  By signing my name below, I, Leonard Bryant, attest that this documentation has been prepared under the direction and in the presence of [place], PA-C. Electronically Signed: Verlee Monte, Medical Scribe. 01/02/16. 8:57 AM.  Date:  01/02/2016   Name:  Leonard Bryant   DOB:  Mar 04, 1951   MRN:  [id number]  PCP:  Ellsworth Lennox, MD   Chief Complaint  Patient presents with   Annual Exam   Medication Refill    Flonase    History of Present Illness:  Leonard Bryant is a 65 y.o. male patient who presents to [facility] for a physical annual exam. Pt would like to check for DM.  Diet: Pt has been eating "healthy" for the last year. Pt takes 2000 mg of Vit C. Pt eats every 2 hours- "peanut butter to go". Pt broils his food, and eats brussel sprouts. Pt drinks about 3 16oz glasses of water before work, and drinks 4 12 oz bottles of water when he works outside, totaling about 36-48 oz. Pt drinks 16 oz of coffee every day. Pt doesn't drink soda or eat fast food often.  BM: Pt is regular when he eats benefiber. Pt states his urine is yellow. Pt denies bloody stool, melena, diarrhea, nocturia, irregular urinary symptoms.  Sleep: Pt gets about 5 hours of sleep. Pt gets home at 9pm, eats his dinner and doses off in front of the tv before getting in bed, and goes to bed at 12am - 12:30am. Pt states he'll occasionally "fight sleep". Pt wakes up at 4:30am to start his day and uses the bathroom then.  Exercise: Pt occasionally works outside.  Cancer Screening: Prostate CA: Pt goes to a urologist and was recently told to come in 1x a year after going 2x a year. Pt has an enlarged prostate. Pt gets his PSA checked there, but would like to get it checked here as well. Colon CA: Pt hasn't had a colonoscopy in a long time. Pt would like to get a referral.  Vision:  Visual Acuity Screening   Right eye Left eye Both eyes    Without correction:     With correction: 20/15 -1 20/15 -1 20/15   Immunizations: Pt deferred the zostavax and flu shot. Immunization History  Administered Date(s) Administered   Tdap 03/15/2014   Depression: Pt doesn't do anything for fun because he's too busy. Depression screen [facility] 2/9 01/02/2016 01/20/2015 05/23/2014  Decreased Interest 0 0 0  Down, Depressed, Hopeless 0 0 0  PHQ - 2 Score 0 0 0   Back: Pt states he can't bend over without bending his knees or he'll "blow his back out". Pt complains of his a pain in his sternum when he stretches his arms.  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  FHx: No hx of multiple myeloma  Patient Active Problem List   Diagnosis Date Noted   Refusal of blood transfusions as patient is Jehovah's Witness 05/23/2014   BPH (benign prostatic hyperplasia) 03/15/2014   Erectile dysfunction 03/15/2014   Leukopenia 05/06/2012   Allergic rhinitis 05/06/2012   Hyperlipidemia     Past Medical History:  Diagnosis Date   Allergy    congestion   Arthritis    Possible arthritis in neck   Hyperlipidemia    Was borderline in the past    Past Surgical History:  Procedure Laterality Date   HERNIA REPAIR  Femoral?    Social History  Substance Use Topics   Smoking status: Former Smoker    Packs/day: 1.50    Years: 5.00    Types: Cigarettes   Smokeless tobacco: Not on file   Alcohol use Yes     Comment: occasional    Family History  Problem Relation Age of Onset   Diabetes Mother    Heart disease Mother    Stroke Mother     Allergies  Allergen Reactions   Pseudoephedrine Other (See Comments)    Testicular pain and tenderness   Penicillins Rash    Has patient had a PCN reaction causing immediate rash, facial/tongue/throat swelling, SOB or lightheadedness with hypotension: No Has patient had a PCN reaction causing severe rash involving mucus membranes or skin necrosis: No Has patient had a PCN  reaction that required hospitalization No Has patient had a PCN reaction occurring within the last 10 years: No If all of the above answers are "NO", then may proceed with Cephalosporin use.     Medication list has been reviewed and updated.  Current Outpatient Prescriptions on File Prior to Visit  Medication Sig Dispense Refill   ibuprofen (ADVIL,MOTRIN) 200 MG tablet Take 400 mg by mouth every 6 (six) hours as needed for pain.      fluticasone (FLONASE) 50 MCG/ACT nasal spray PLACE 2 SPRAYS INTO BOTH NOSTRILS DAILY. (Patient not taking: Reported on 01/02/2016) 48 g 3   No current facility-administered medications on file prior to visit.     Review of Systems  Gastrointestinal: Negative for blood in stool, diarrhea and melena.  Genitourinary: Negative for frequency and urgency.  Psychiatric/Behavioral: The patient does not have insomnia.    Physical Examination: BP 132/84    Pulse 74    Temp 98.2 F (36.8 C) (Oral)    Resp 18    Ht _0  (1.727 m)    Wt 174 lb (78.9 kg)    SpO2 99%    BMI 26.46 kg/m  Ideal Body Weight: _1 ([id number])@  Physical Exam  Constitutional: He is oriented to person, place, and time. He appears well-developed and well-nourished.  HENT:  Head: Normocephalic and atraumatic.  Right Ear: External ear normal.  Left Ear: External ear normal.  Mouth/Throat: Oropharynx is clear and moist.  TM's intact bilaterally, no effusions or erythema. Nasal turbinates pink and moist, nasal passages patent. No sinus tenderness. Oropharynx clear, mucous membranes moist, dentition in good repair.  Eyes: Conjunctivae and EOM are normal. Pupils are equal, round, and reactive to light. Right eye exhibits no discharge. Left eye exhibits no discharge. No scleral icterus.  Neck: Normal range of motion. Neck supple. No thyromegaly present.  Cardiovascular: Normal rate, regular rhythm and intact distal pulses.  Exam reveals no gallop and no friction rub.   No murmur  heard. Pulmonary/Chest: Effort normal and breath sounds normal. No stridor. No respiratory distress. He has no wheezes. He has no rales.  Abdominal: Soft. Bowel sounds are normal. He exhibits no distension and no mass. There is no tenderness.  Musculoskeletal: Normal range of motion. He exhibits no edema or tenderness.  Lymphadenopathy:    He has no cervical adenopathy.  Neurological: He is alert and oriented to person, place, and time. He has normal reflexes.  Skin: Skin is warm and dry. No rash noted. No erythema. No pallor.  Psychiatric: He has a normal mood and affect.   Assessment and Plan: GARELD OBRECHT is a 65 y.o. male who is here today for  annual physical exam. Patient will have colonoscopy. Patient declines flu vaccine. Annual physical exam - Plan: CBC, COMPLETE METABOLIC PANEL WITH GFR, TSH, PSA, Hemoglobin A1c  Screening for metabolic disorder - Plan: COMPLETE METABOLIC PANEL WITH GFR  Screening for deficiency anemia - Plan: CBC  Screening for prostate cancer - Plan: PSA  Screening for diabetes mellitus - Plan: Hemoglobin A1c  Screening for thyroid disorder - Plan: TSH  I personally performed the services described in this documentation, which was scribed in my presence. The recorded information has been reviewed and is accurate.

## 2016-01-03 LAB — HEMOGLOBIN A1C
Hgb A1c MFr Bld: 5 % (ref <5.7)
MEAN PLASMA GLUCOSE: 97 mg/dL

## 2016-02-06 IMAGING — DX DG CHEST 2V
2 series · 2 of 2 positions shown · non-contrast
Comparison: None

CLINICAL DATA: Central chest pressure and shortness of breath last
night, worked with asbestos for 4 years, former smoker

EXAM:
CHEST  2 VIEW

[chest pa]
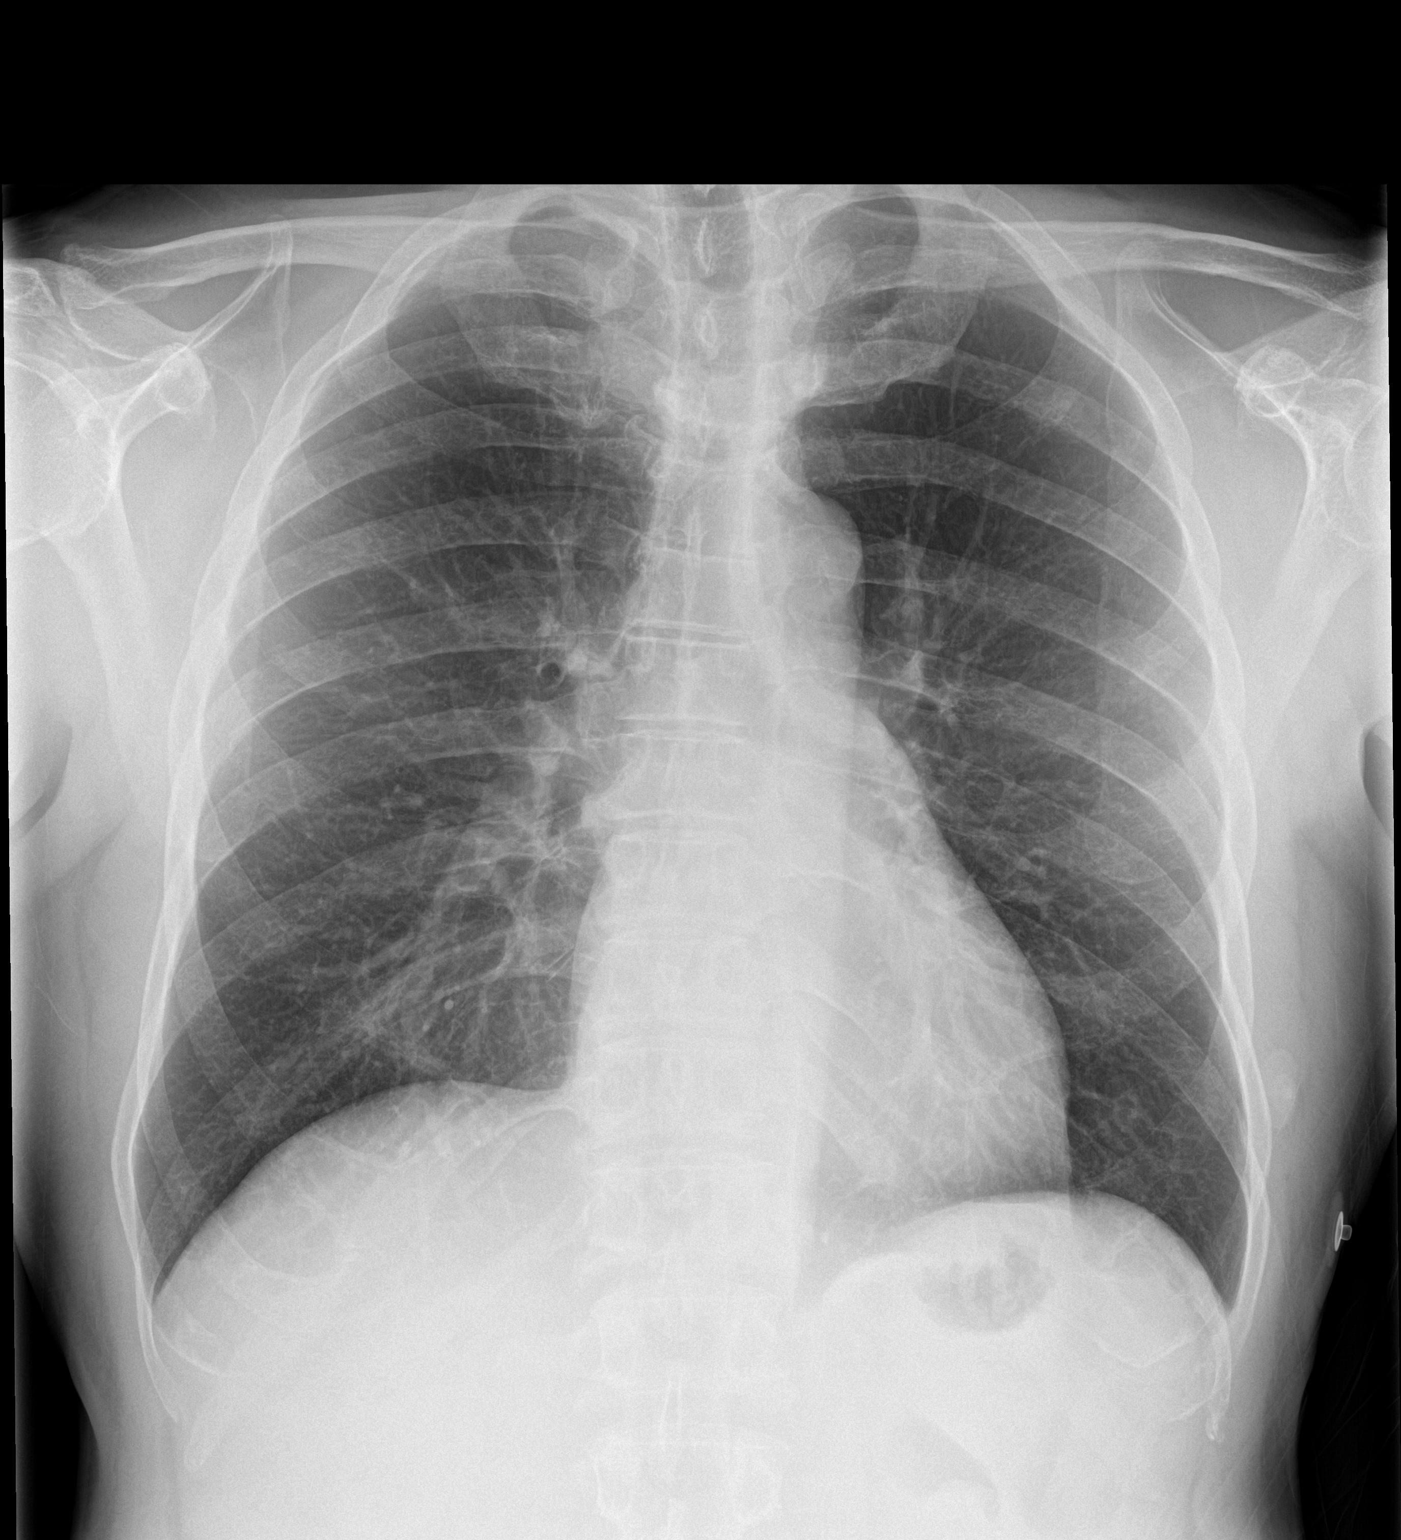

[chest lat]
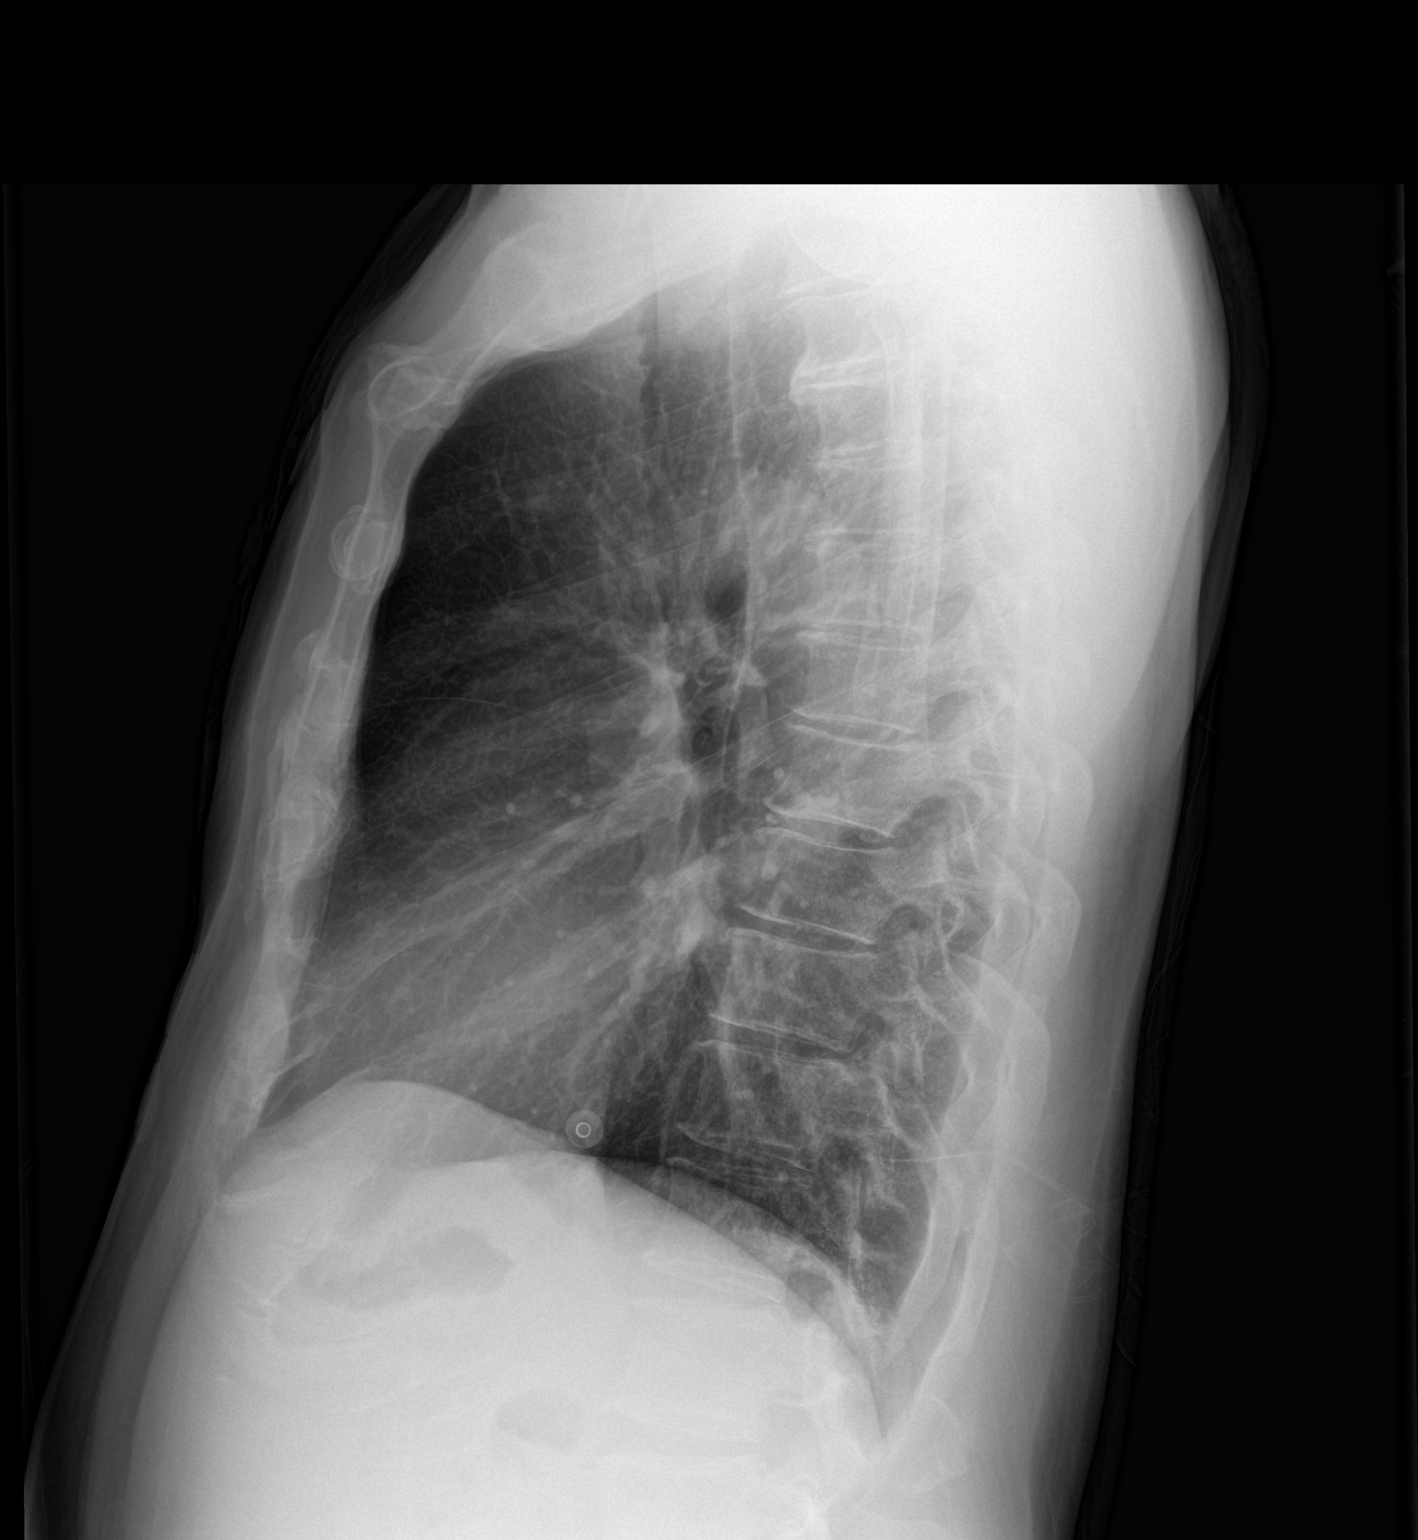

[2 of 2 positions shown; findings below may reference images not displayed]

FINDINGS: Normal heart size, mediastinal contours, and pulmonary vascularity.

Minimal peribronchial thickening and hyperinflation.

No pulmonary infiltrate, pleural effusion or pneumothorax.

Bones unremarkable.
IMPRESSION: Minimal bronchitic changes without acute infiltrate.

## 2017-03-03 DIAGNOSIS — N401 Enlarged prostate with lower urinary tract symptoms: Secondary | ICD-10-CM | POA: Diagnosis not present

## 2017-03-03 DIAGNOSIS — R350 Frequency of micturition: Secondary | ICD-10-CM | POA: Diagnosis not present

## 2017-03-18 ENCOUNTER — Encounter: Payer: Self-pay | Admitting: Physician Assistant

## 2017-03-18 ENCOUNTER — Ambulatory Visit: Payer: Federal, State, Local not specified - PPO | Admitting: Physician Assistant

## 2017-03-18 VITALS — BP 142/86 | HR 80 | Temp 98.7°F | Resp 16 | Ht 68.0 in | Wt 181.4 lb

## 2017-03-18 DIAGNOSIS — L819 Disorder of pigmentation, unspecified: Secondary | ICD-10-CM

## 2017-03-18 DIAGNOSIS — Z130 Encounter for screening for diseases of the blood and blood-forming organs and certain disorders involving the immune mechanism: Secondary | ICD-10-CM | POA: Diagnosis not present

## 2017-03-18 DIAGNOSIS — Z13220 Encounter for screening for lipoid disorders: Secondary | ICD-10-CM

## 2017-03-18 DIAGNOSIS — N401 Enlarged prostate with lower urinary tract symptoms: Secondary | ICD-10-CM | POA: Diagnosis not present

## 2017-03-18 DIAGNOSIS — Z13228 Encounter for screening for other metabolic disorders: Secondary | ICD-10-CM

## 2017-03-18 DIAGNOSIS — R21 Rash and other nonspecific skin eruption: Secondary | ICD-10-CM

## 2017-03-18 DIAGNOSIS — Z1329 Encounter for screening for other suspected endocrine disorder: Secondary | ICD-10-CM | POA: Diagnosis not present

## 2017-03-18 DIAGNOSIS — Z1211 Encounter for screening for malignant neoplasm of colon: Secondary | ICD-10-CM | POA: Diagnosis not present

## 2017-03-18 DIAGNOSIS — Z0000 Encounter for general adult medical examination without abnormal findings: Secondary | ICD-10-CM

## 2017-03-18 NOTE — Patient Instructions (Addendum)
Please await contact for the colonoscopy.   I would like you to make sure you are moisturizing twice per day.  This can be with the Aveeno at home.  You may also try Cetaphil, Aquaphor, Eucerin.  Then you may place the oil on top to hold the moisture in.  Please let me know if this is not improving. I am referring you to a dermatologist at this time.    Keeping you healthy  Get these tests  Blood pressure- Have your blood pressure checked once a year by your healthcare provider.  Normal blood pressure is 120/80  Weight- Have your body mass index (BMI) calculated to screen for obesity.  BMI is a measure of body fat based on height and weight. You can also calculate your own BMI at ViewBanking.si.  Cholesterol- Have your cholesterol checked every year.  Diabetes- Have your blood sugar checked regularly if you have high blood pressure, high cholesterol, have a family history of diabetes or if you are overweight.  Screening for Colon Cancer- Colonoscopy starting at age 55.  Screening may begin sooner depending on your family history and other health conditions. Follow up colonoscopy as directed by your Gastroenterologist.  Screening for Prostate Cancer- Both blood work (PSA) and a rectal exam help screen for Prostate Cancer.  Screening begins at age 34 with African-American men and at age 77 with Caucasian men.  Screening may begin sooner depending on your family history.  Take these medicines  Aspirin- One aspirin daily can help prevent Heart disease and Stroke.  Flu shot- Every fall.  Tetanus- Every 10 years.  Zostavax- Once after the age of 71 to prevent Shingles.  Pneumonia shot- Once after the age of 72; if you are younger than 53, ask your healthcare provider if you need a Pneumonia shot.  Take these steps  Don't smoke- If you do smoke, talk to your doctor about quitting.  For tips on how to quit, go to www.smokefree.gov or call 1-800-QUIT-NOW.  Be physically active-  Exercise 5 days a week for at least 30 minutes.  If you are not already physically active start slow and gradually work up to 30 minutes of moderate physical activity.  Examples of moderate activity include walking briskly, mowing the yard, dancing, swimming, bicycling, etc.  Eat a healthy diet- Eat a variety of healthy food such as fruits, vegetables, low fat milk, low fat cheese, yogurt, lean meant, poultry, fish, beans, tofu, etc. For more information go to www.thenutritionsource.org  Drink alcohol in moderation- Limit alcohol intake to less than two drinks a day. Never drink and drive.  Dentist- Brush and floss twice daily; visit your dentist twice a year.  Depression- Your emotional health is as important as your physical health. If you're feeling down, or losing interest in things you would normally enjoy please talk to your healthcare provider.  Eye exam- Visit your eye doctor every year.  Safe sex- If you may be exposed to a sexually transmitted infection, use a condom.  Seat belts- Seat belts can save your life; always wear one.  Smoke/Carbon Monoxide detectors- These detectors need to be installed on the appropriate level of your home.  Replace batteries at least once a year.  Skin cancer- When out in the sun, cover up and use sunscreen 15 SPF or higher.  Violence- If anyone is threatening you, please tell your healthcare provider.  Living Will/ Health care power of attorney- Speak with your healthcare provider and family.    IF  you received an x-ray today, you will receive an invoice from [facility]. Please contact [facility] at [phone] with questions or concerns regarding your invoice.   IF you received labwork today, you will receive an invoice from [place]. Please contact [facility] at [phone] with questions or concerns regarding your invoice.   Our billing staff will not be able to assist you with questions regarding bills from these  companies.  You will be contacted with the lab results as soon as they are available. The fastest way to get your results is to activate your My Chart account. Instructions are located on the last page of this paperwork. If you have not heard from [place] regarding the results in 2 weeks, please contact this office.

## 2017-03-18 NOTE — Progress Notes (Signed)
PRIMARY CARE AT [facility] [address], [place] [address] [phone]  Date:  03/18/2017   Name:  Leonard Bryant   DOB:  August 30, 1950   MRN:  [id number]  PCP:  Barton Fanny, MD    History of Present Illness:  Leonard Bryant is a 66 y.o. male patient who presents to PCP with  Chief Complaint  Patient presents with  . Annual Exam      Diet: Pt has been eating "healthy" for the last year. Pt takes 2000 mg of Vit C. Pt eats every 2 hours- "peanut butter to go". Pt broils his food, and eats brussel sprouts. Pt drinks about 32 oz per day.  Pt drinks 12 oz of coffee every day. He has been eating some fast foods.    BM and urination: Pt is regular when he eats benefiber. Pt states his urine is yellow. Pt denies bloody stool, melena, diarrhea, nocturia, irregular urinary symptoms.  Sleep: Pt gets about 5 hours of sleep. Pt gets home at 9pm, eats his dinner and doses off in front of the tv before getting in bed, and goes to bed at 12am - 12:30am. Pt states he'll occasionally "fight sleep". Pt wakes up at 4:30am to start his day and uses the bathroom then.  Exercise: Pt occasionally works outside.  Cancer Screening: Prostate CA: Pt saw the urologist less than 1 month ago.  Pt gets his PSA checked there, but would like to get it checked here as well today.  Constipated has difficulty with the urination.  He is not on any prostate medications for bph.   Colon CA: Pt hasn't had a colonoscopy in a long time. Pt would like to get a referral.  Patient is also complaining of rash along his chest.  This has no symptoms but he has noticed change in darkening as well as some redness to the area of his chest.   FHx: No hx of multiple myeloma   Patient Active Problem List   Diagnosis Date Noted  . Refusal of blood transfusions as patient is Jehovah's Witness 05/23/2014  . BPH (benign prostatic hyperplasia) 03/15/2014  . Erectile dysfunction 03/15/2014  . Leukopenia 05/06/2012  . Allergic  rhinitis 05/06/2012  . Hyperlipidemia     Past Medical History:  Diagnosis Date  . Allergy    congestion  . 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 Hyperlipidemia    Was borderline in the past    Past Surgical History:  Procedure Laterality Date  . HERNIA REPAIR     Femoral?    Social History   Tobacco Use  . Smoking status: Former Smoker    Packs/day: 1.50    Years: 5.00    Pack years: 7.50    Types: Cigarettes  . Smokeless tobacco: Never Used  Substance Use Topics  . Alcohol use: Yes    Comment: occasional  . Drug use: No    Family History  Problem Relation Age of Onset  . Diabetes Mother   . Heart disease Mother   . Stroke Mother     Allergies  Allergen Reactions  . Pseudoephedrine Other (See Comments)    Testicular pain and tenderness  . Penicillins Rash    Has patient had a PCN reaction causing immediate rash, facial/tongue/throat swelling, SOB or lightheadedness with hypotension: No Has patient had a PCN reaction causing severe rash involving mucus membranes or skin necrosis: No Has patient had a PCN reaction that required hospitalization No  Has patient had a PCN reaction occurring within the last 10 years: No If all of the above answers are "NO", then may proceed with Cephalosporin use.     Medication list has been reviewed and updated.  Current Outpatient Medications on File Prior to Visit  Medication Sig Dispense Refill  . ibuprofen (ADVIL,MOTRIN) 200 MG tablet Take 400 mg by mouth every 6 (six) hours as needed for pain.     . fluticasone (FLONASE) 50 MCG/ACT nasal spray PLACE 2 SPRAYS INTO BOTH NOSTRILS DAILY. (Patient not taking: Reported on 03/18/2017) 48 g 3   No current facility-administered medications on file prior to visit.     Review of Systems  Constitutional: Negative for chills and fever.  HENT: Negative for ear discharge, ear pain and sore throat.   Eyes: Negative for blurred vision and double vision.  Respiratory: Negative  for cough, shortness of breath and wheezing.   Cardiovascular: Negative for chest pain, palpitations and leg swelling.  Gastrointestinal: Negative for diarrhea, nausea and vomiting.  Genitourinary: Negative for dysuria, frequency and hematuria.  Skin: Negative for itching and rash.  Neurological: Negative for dizziness and headaches.   ROS otherwise unremarkable unless listed above.  Physical Examination: BP (!) 142/86 (BP Location: Right Arm, Patient Position: Sitting, Cuff Size: Normal)   Pulse 80   Temp 98.7 F (37.1 C) (Oral)   Resp 16   Ht _0  (1.727 m)   Wt 181 lb 6.4 oz (82.3 kg)   SpO2 95%   BMI 27.58 kg/m  Ideal Body Weight: Weight in (lb) to have BMI = 25: 164.1  Physical Exam  Constitutional: He is oriented to person, place, and time. He appears well-developed and well-nourished. No distress.  HENT:  Head: Normocephalic and atraumatic.  Right Ear: Tympanic membrane, external ear and ear canal normal.  Left Ear: Tympanic membrane, external ear and ear canal normal.  Eyes: Conjunctivae and EOM are normal. Pupils are equal, round, and reactive to light.  Cardiovascular: Normal rate and regular rhythm. Exam reveals no friction rub.  No murmur heard. Pulmonary/Chest: Effort normal. No respiratory distress. He has no wheezes.  Upper chest with mild hyperpigmentation and follicles appear more present (papular).  Abdominal: Soft. Bowel sounds are normal. He exhibits no distension and no mass. There is no tenderness.  Musculoskeletal: Normal range of motion. He exhibits no edema or tenderness.  Neurological: He is alert and oriented to person, place, and time. He displays normal reflexes.  Skin: Skin is warm and dry. He is not diaphoretic.  Psychiatric: He has a normal mood and affect. His behavior is normal.     Assessment and Plan: Leonard Bryant is a 66 y.o. male who is here today for cc of  Chief Complaint  Patient presents with  . Annual Exam   Physical exam -  Plan: Ambulatory referral to Gastroenterology, CBC, CMP14+EGFR, TSH, PSA, Lipid panel  Special screening for malignant neoplasms, colon - Plan: Ambulatory referral to Gastroenterology  Screening for deficiency anemia - Plan: CBC  Screening for metabolic disorder - Plan: CMP14+EGFR  Screening for thyroid disorder - Plan: TSH  Benign prostatic hyperplasia with lower urinary tract symptoms, symptom details unspecified - Plan: PSA  Screening for lipid disorders - Plan: Lipid panel  Hyperpigmentation of skin - Plan: Ambulatory referral to Dermatology  Rash and nonspecific skin eruption - Plan: Ambulatory referral to Dermatology  Ivar Drape, PA-C [facility] 12/19/20184:20 PM

## 2017-03-19 LAB — CMP14+EGFR
ALBUMIN: 4.8 g/dL (ref 3.6–4.8)
ALK PHOS: 60 IU/L (ref 39–117)
ALT: 29 IU/L (ref 0–44)
AST: 33 IU/L (ref 0–40)
Albumin/Globulin Ratio: 2.1 (ref 1.2–2.2)
BILIRUBIN TOTAL: 0.5 mg/dL (ref 0.0–1.2)
BUN / CREAT RATIO: 16 (ref 10–24)
BUN: 17 mg/dL (ref 8–27)
CHLORIDE: 107 mmol/L — AB (ref 96–106)
CO2: 23 mmol/L (ref 20–29)
Calcium: 10.3 mg/dL — ABNORMAL HIGH (ref 8.6–10.2)
Creatinine, Ser: 1.04 mg/dL (ref 0.76–1.27)
GFR calc Af Amer: 87 mL/min/{1.73_m2} (ref >59)
GFR calc non Af Amer: 75 mL/min/{1.73_m2} (ref >59)
GLOBULIN, TOTAL: 2.3 g/dL (ref 1.5–4.5)
Glucose: 103 mg/dL — ABNORMAL HIGH (ref 65–99)
Potassium: 4.9 mmol/L (ref 3.5–5.2)
SODIUM: 143 mmol/L (ref 134–144)
Total Protein: 7.1 g/dL (ref 6.0–8.5)

## 2017-03-19 LAB — CBC
HEMATOCRIT: 42.1 % (ref 37.5–51.0)
HEMOGLOBIN: 14.6 g/dL (ref 13.0–17.7)
MCH: 28.7 pg (ref 26.6–33.0)
MCHC: 34.7 g/dL (ref 31.5–35.7)
MCV: 83 fL (ref 79–97)
Platelets: 268 10*3/uL (ref 150–379)
RBC: 5.08 x10E6/uL (ref 4.14–5.80)
RDW: 13.7 % (ref 12.3–15.4)
WBC: 4 10*3/uL (ref 3.4–10.8)

## 2017-03-19 LAB — LIPID PANEL
CHOL/HDL RATIO: 3.6 ratio (ref 0.0–5.0)
Cholesterol, Total: 174 mg/dL (ref 100–199)
HDL: 49 mg/dL (ref >39)
LDL Calculated: 116 mg/dL — ABNORMAL HIGH (ref 0–99)
Triglycerides: 46 mg/dL (ref 0–149)
VLDL Cholesterol Cal: 9 mg/dL (ref 5–40)

## 2017-03-19 LAB — TSH: TSH: 1.6 u[IU]/mL (ref 0.450–4.500)

## 2017-03-19 LAB — PSA: Prostate Specific Ag, Serum: 4.6 ng/mL — ABNORMAL HIGH (ref 0.0–4.0)

## 2017-07-08 ENCOUNTER — Encounter: Payer: Self-pay | Admitting: Physician Assistant

## 2018-12-07 ENCOUNTER — Other Ambulatory Visit: Payer: Self-pay

## 2018-12-07 ENCOUNTER — Ambulatory Visit
Admission: EM | Admit: 2018-12-07 | Discharge: 2018-12-07 | Disposition: A | Discharge status: Home / Self Care | Payer: Federal, State, Local not specified - PPO

## 2018-12-07 DIAGNOSIS — J309 Allergic rhinitis, unspecified: Secondary | ICD-10-CM

## 2018-12-07 NOTE — ED Triage Notes (Signed)
Pt presents to UC w/ c/o headache x2 days. Pt reports having postnasal drip and taking an antihistamine which helped with the postnasal drip but headache is still present. Pt does not currently have a headache.

## 2018-12-07 NOTE — ED Provider Notes (Signed)
[name]    CSN: [id number] Arrival date & time: 12/07/18  [id number]      History   Chief Complaint Chief Complaint  Patient presents with  . Headache    HPI Leonard Bryant is a 68 y.o. male presenting for intermittent frontal headache, postnasal drip since Sunday.  Patient does not have a headache today, postnasal drip is been controlled.  Patient tried Zyrtec a few days ago, the states this made his headache worse.  Patient states that he manages his symptoms at home with a home remedy of boiled onion, garlic, ginger, which he drinks at room temperature.  Patient denies sinus pain, ear pain, jaw pain.  Does have dry, scratchy throat that is worse in the morning.  Overall, patient feels his symptoms are consistent with his seasonal allergies, though given COVID pandemic was wondering if he should get tested.  States his employer is not requiring at this time, nor has he had any known exposures.    Past Medical History:  Diagnosis Date  . Allergy    congestion  . 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 Hyperlipidemia    Was borderline in the past    Patient Active Problem List   Diagnosis Date Noted  . Refusal of blood transfusions as patient is Jehovah's Witness 05/23/2014  . BPH (benign prostatic hyperplasia) 03/15/2014  . Erectile dysfunction 03/15/2014  . Leukopenia 05/06/2012  . Allergic rhinitis 05/06/2012  . Hyperlipidemia     Past Surgical History:  Procedure Laterality Date  . HERNIA REPAIR     Femoral?       Home Medications    Prior to Admission medications   Medication Sig Start Date End Date Taking? Authorizing Provider  aspirin EC 81 MG tablet Take 81 mg by mouth daily.   Yes [provider]  Multiple Vitamin (MULTIVITAMIN) tablet Take 1 tablet by mouth daily.   Yes [provider]  ibuprofen (ADVIL,MOTRIN) 200 MG tablet Take 400 mg by mouth every 6 (six) hours as needed for pain.     [provider]   fluticasone (FLONASE) 50 MCG/ACT nasal spray PLACE 2 SPRAYS INTO BOTH NOSTRILS DAILY. Patient not taking: Reported on 03/18/2017 03/27/14 12/07/18  Araceli Bouche, PA    Family History Family History  Problem Relation Age of Onset  . Diabetes Mother   . Heart disease Mother   . Stroke Mother     Social History Social History   Tobacco Use  . Smoking status: Former Smoker    Packs/day: 1.50    Years: 5.00    Pack years: 7.50    Types: Cigarettes  . Smokeless tobacco: Never Used  Substance Use Topics  . Alcohol use: Yes    Comment: glass of wine or beer a day  . Drug use: No     Allergies   Pseudoephedrine and Penicillins   Review of Systems Review of Systems  Constitutional: Negative for fatigue and fever.  HENT: Positive for postnasal drip, rhinorrhea and sore throat. Negative for congestion, dental problem, ear pain, facial swelling, hearing loss, sinus pressure, sinus pain, trouble swallowing and voice change.   Eyes: Negative for photophobia, pain and visual disturbance.  Respiratory: Negative for cough and shortness of breath.   Cardiovascular: Negative for chest pain and palpitations.  Gastrointestinal: Negative for abdominal pain, diarrhea and vomiting.  Musculoskeletal: Negative for arthralgias and myalgias.  Skin: Negative for rash and wound.  Neurological: Negative for dizziness, speech difficulty and  headaches.  All other systems reviewed and are negative.    Physical Exam Triage Vital Signs ED Triage Vitals  Enc Vitals Group     BP 12/07/18 0848 (!) 153/95     Pulse Rate 12/07/18 0846 73     Resp 12/07/18 0846 16     Temp 12/07/18 0846 98.3 F (36.8 C)     Temp Source 12/07/18 0846 Oral     SpO2 12/07/18 0846 99 %     Weight --      Height --      Head Circumference --      Peak Flow --      Pain Score 12/07/18 0851 0     Pain Loc --      Pain Edu? --      Excl. in [place]? --    No data found.  Updated Vital Signs BP (!) 153/95 (BP Location:  Left Arm)   Pulse 73   Temp 98.3 F (36.8 C) (Oral)   Resp 16   SpO2 99%   Visual Acuity Right Eye Distance:   Left Eye Distance:   Bilateral Distance:    Right Eye Near:   Left Eye Near:    Bilateral Near:     Physical Exam Constitutional:      General: He is not in acute distress.    Appearance: He is well-developed and normal weight. He is not ill-appearing.  HENT:     Head: Normocephalic and atraumatic.     Right Ear: Tympanic membrane, ear canal and external ear normal.     Left Ear: Tympanic membrane, ear canal and external ear normal.     Nose: No nasal deformity, congestion or rhinorrhea.     Comments: Mild bilateral turbinate edema with mucosal pallor     Mouth/Throat:     Mouth: Mucous membranes are moist.     Tongue: Tongue does not deviate from midline.     Pharynx: Oropharynx is clear. Uvula midline.     Comments: No tonsillar hypertrophy or exudate.  Cobblestoning present in posterior pharynx Eyes:     General: No scleral icterus.    Conjunctiva/sclera: Conjunctivae normal.     Pupils: Pupils are equal, round, and reactive to light.  Neck:     Musculoskeletal: Normal range of motion and neck supple. No muscular tenderness.  Cardiovascular:     Rate and Rhythm: Normal rate and regular rhythm.     Heart sounds: No murmur. No gallop.   Pulmonary:     Effort: Pulmonary effort is normal. No respiratory distress.     Breath sounds: No wheezing, rhonchi or rales.  Musculoskeletal: Normal range of motion.        General: No swelling.  Lymphadenopathy:     Cervical: No cervical adenopathy.  Skin:    General: Skin is warm.     Capillary Refill: Capillary refill takes less than 2 seconds.     Coloration: Skin is not cyanotic, jaundiced or pale.  Neurological:     Mental Status: He is alert and oriented to person, place, and time.     Cranial Nerves: No dysarthria or facial asymmetry.     Motor: No weakness.     Gait: Gait normal.     Deep Tendon Reflexes:  Reflexes normal.  Psychiatric:        Mood and Affect: Mood normal.        Speech: Speech normal.        Behavior: Behavior normal.  UC Treatments / Results  Labs (all labs ordered are listed, but only abnormal results are displayed) Labs Reviewed - No data to display  EKG   Radiology No results found.  Procedures Procedures (including critical care time)  Medications Ordered in UC Medications - No data to display  Initial Impression / Assessment and Plan / UC Course  I have reviewed the triage vital signs and the nursing notes.  Pertinent labs & imaging results that were available during my care of the patient were reviewed by me and considered in my medical decision making (see chart for details).     1.  Allergic rhinitis History and physical consistent with patient's seasonal allergies.  Will continue home remedies as these have been providing relief.  Patient afebrile, without known contact.  Patient consulted with his employer at time of visit: COVID testing not mandated.  Return to work note provided.  Will monitor symptoms.  Return precautions discussed, patient verbalized understanding and is agreeable to plan. Final Clinical Impressions(s) / UC Diagnoses   Final diagnoses:  Allergic rhinitis, unspecified seasonality, unspecified trigger     Discharge Instructions     Return for worsening nasal congestion, sinus pain, fever, headache, sore throat, cough    ED Prescriptions    None     Controlled Substance Prescriptions [place] Controlled Substance Registry consulted? No   [place], [place], PA-C 12/07/18 1002

## 2018-12-07 NOTE — Discharge Instructions (Signed)
Return for worsening nasal congestion, sinus pain, fever, headache, sore throat, cough

## 2018-12-29 ENCOUNTER — Telehealth: Payer: Self-pay

## 2018-12-29 NOTE — Telephone Encounter (Signed)

## 2018-12-30 ENCOUNTER — Ambulatory Visit (INDEPENDENT_AMBULATORY_CARE_PROVIDER_SITE_OTHER): Payer: Federal, State, Local not specified - PPO | Admitting: Family Medicine

## 2018-12-30 ENCOUNTER — Other Ambulatory Visit: Payer: Self-pay

## 2018-12-30 VITALS — BP 153/89 | HR 70 | Temp 97.4°F | Resp 17 | Ht 66.0 in | Wt 174.8 lb

## 2018-12-30 DIAGNOSIS — Z1211 Encounter for screening for malignant neoplasm of colon: Secondary | ICD-10-CM

## 2018-12-30 DIAGNOSIS — Z23 Encounter for immunization: Secondary | ICD-10-CM

## 2018-12-30 DIAGNOSIS — R030 Elevated blood-pressure reading, without diagnosis of hypertension: Secondary | ICD-10-CM

## 2018-12-30 DIAGNOSIS — Z1159 Encounter for screening for other viral diseases: Secondary | ICD-10-CM | POA: Diagnosis not present

## 2018-12-30 DIAGNOSIS — Z0000 Encounter for general adult medical examination without abnormal findings: Secondary | ICD-10-CM

## 2018-12-30 DIAGNOSIS — Z1389 Encounter for screening for other disorder: Secondary | ICD-10-CM | POA: Diagnosis not present

## 2018-12-30 DIAGNOSIS — Z13228 Encounter for screening for other metabolic disorders: Secondary | ICD-10-CM

## 2018-12-30 DIAGNOSIS — Z125 Encounter for screening for malignant neoplasm of prostate: Secondary | ICD-10-CM

## 2018-12-30 DIAGNOSIS — J3089 Other allergic rhinitis: Secondary | ICD-10-CM

## 2018-12-30 LAB — POCT URINALYSIS DIP (CLINITEK)
Bilirubin, UA: NEGATIVE
Blood, UA: NEGATIVE
Glucose, UA: NEGATIVE mg/dL
Ketones, POC UA: NEGATIVE mg/dL
Leukocytes, UA: NEGATIVE
Nitrite, UA: NEGATIVE
POC PROTEIN,UA: NEGATIVE
Spec Grav, UA: 1.02 (ref 1.010–1.025)
Urobilinogen, UA: 0.2 E.U./dL
pH, UA: 6 (ref 5.0–8.0)

## 2018-12-30 MED ORDER — FLUTICASONE PROPIONATE 50 MCG/ACT NA SUSP: 2.0000 | Freq: Every day | NASAL | 1 refills | Status: DC

## 2018-12-30 NOTE — Progress Notes (Signed)
Subjective:  Patient ID: Leonard Bryant, male    DOB: Apr 08, 1950  Age: 68 y.o. MRN: [id number]  CC:   Leonard Bryant is a 68 year old male who presents today to establish care and is due for an annual physical. He was previously followed by [facility]. His blood pressure is elevated and he has no known history of Hypertension. He also complains of allergies (which he describes as chest congestion and sometimes nasal but no throat symptoms) which occur during this time of the year and he uses Honey some times for it at other times uses his netty pot. In the past he used Flonase. He requests a COVID-19 antibody test; states "his insurance will pay for it" He usually sees urology for rectal exams.  Past Medical History:  Diagnosis Date  . Allergy    congestion  . 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 Hyperlipidemia    Was borderline in the past    Past Surgical History:  Procedure Laterality Date  . HERNIA REPAIR     Femoral?    Family History  Problem Relation Age of Onset  . Diabetes Mother   . Heart disease Mother   . Stroke Mother     Allergies  Allergen Reactions  . Pseudoephedrine Other (See Comments)    Testicular pain and tenderness  . Penicillins Rash    Has patient had a PCN reaction causing immediate rash, facial/tongue/throat swelling, SOB or lightheadedness with hypotension: No Has patient had a PCN reaction causing severe rash involving mucus membranes or skin necrosis: No Has patient had a PCN reaction that required hospitalization No Has patient had a PCN reaction occurring within the last 10 years: No If all of the above answers are "NO", then may proceed with Cephalosporin use.     Outpatient Medications Prior to Visit  Medication Sig Dispense Refill  . aspirin EC 81 MG tablet Take 81 mg by mouth daily.    . Multiple Vitamin (MULTIVITAMIN) tablet Take 1 tablet by mouth daily.    Marland Kitchen ibuprofen (ADVIL,MOTRIN) 200 MG tablet Take 400 mg by  mouth every 6 (six) hours as needed for pain.      No facility-administered medications prior to visit.      ROS Review of Systems  Constitutional: Negative for activity change and appetite change.  HENT: Positive for congestion. Negative for sinus pressure and sore throat.   Eyes: Negative for visual disturbance.  Respiratory: Negative for cough, chest tightness and shortness of breath.   Cardiovascular: Negative for chest pain and leg swelling.  Gastrointestinal: Negative for abdominal distention, abdominal pain, constipation and diarrhea.  Endocrine: Negative.   Genitourinary: Negative for dysuria.  Musculoskeletal: Negative for joint swelling and myalgias.  Skin: Negative for rash.  Allergic/Immunologic: Negative.   Neurological: Negative for weakness, light-headedness and numbness.  Psychiatric/Behavioral: Negative for dysphoric mood and suicidal ideas.    Objective:  BP (!) 153/89   Pulse 70   Temp (!) 97.4 F (36.3 C) (Temporal)   Resp 17   Ht 5' 6"  (1.676 m)   Wt 174 lb 12.8 oz (79.3 kg)   SpO2 98%   BMI 28.21 kg/m   BP/Weight 12/30/2018 12/07/2018 27/74/1287  Systolic BP 867 672 094  Diastolic BP 89 95 86  Wt. (Lbs) 174.8 - 181.4  BMI 28.21 - 27.58      Physical Exam Constitutional:      Appearance: He is well-developed.  HENT:  Head: Normocephalic and atraumatic.     Right Ear: External ear normal.     Left Ear: External ear normal.  Eyes:     Conjunctiva/sclera: Conjunctivae normal.     Pupils: Pupils are equal, round, and reactive to light.  Neck:     Musculoskeletal: Normal range of motion and neck supple.     Trachea: No tracheal deviation.  Cardiovascular:     Rate and Rhythm: Normal rate and regular rhythm.     Heart sounds: Normal heart sounds. No murmur.  Pulmonary:     Effort: Pulmonary effort is normal. No respiratory distress.     Breath sounds: Normal breath sounds. No wheezing.  Chest:     Chest wall: No tenderness.  Abdominal:      General: Bowel sounds are normal.     Palpations: Abdomen is soft. There is no mass.     Tenderness: There is no abdominal tenderness.  Genitourinary:    Penis: Normal.      Scrotum/Testes: Normal.     Comments: Healed right inguinal scar Musculoskeletal: Normal range of motion.        General: No tenderness.  Skin:    General: Skin is warm and dry.  Neurological:     Mental Status: He is alert and oriented to person, place, and time.     CMP Latest Ref Rng & Units 03/18/2017 01/02/2016 01/20/2015  Glucose 65 - 99 mg/dL 103(H) 99 87  BUN 8 - 27 mg/dL 17 20 16   Creatinine 0.76 - 1.27 mg/dL 1.04 0.96 0.99  Sodium 134 - 144 mmol/L 143 141 138  Potassium 3.5 - 5.2 mmol/L 4.9 4.6 4.0  Chloride 96 - 106 mmol/L 107(H) 109 103  CO2 20 - 29 mmol/L 23 26 29   Calcium 8.6 - 10.2 mg/dL 10.3(H) 10.1 9.7  Total Protein 6.0 - 8.5 g/dL 7.1 6.7 -  Total Bilirubin 0.0 - 1.2 mg/dL 0.5 0.6 -  Alkaline Phos 39 - 117 IU/L 60 41 -  AST 0 - 40 IU/L 33 22 -  ALT 0 - 44 IU/L 29 18 -    Lipid Panel     Component Value Date/Time   CHOL 174 03/18/2017 1049   TRIG 46 03/18/2017 1049   HDL 49 03/18/2017 1049   CHOLHDL 3.6 03/18/2017 1049   CHOLHDL 3.3 03/15/2014 1113   VLDL 15 03/15/2014 1113   LDLCALC 116 (H) 03/18/2017 1049   LDLDIRECT 106 (H) 05/05/2012 1032    CBC    Component Value Date/Time   WBC 4.0 03/18/2017 1049   WBC 2.8 (L) 01/02/2016 0930   RBC 5.08 03/18/2017 1049   RBC 5.05 01/02/2016 0930   HGB 14.6 03/18/2017 1049   HCT 42.1 03/18/2017 1049   PLT 268 03/18/2017 1049   MCV 83 03/18/2017 1049   MCH 28.7 03/18/2017 1049   MCH 28.9 01/02/2016 0930   MCHC 34.7 03/18/2017 1049   MCHC 35.4 01/02/2016 0930   RDW 13.7 03/18/2017 1049   LYMPHSABS 0.7 12/25/2012 0318   MONOABS 0.3 12/25/2012 0318   EOSABS 0.1 12/25/2012 0318   BASOSABS 0.0 12/25/2012 0318    Lab Results  Component Value Date   HGBA1C 5.0 01/02/2016    Assessment & Plan:   1. Annual physical  exam Counseled on 150 minutes of exercise per week, healthy eating (including decreased daily intake of saturated fats, cholesterol, added sugars, sodium), STI prevention, routine healthcare maintenance. - CMP14+EGFR - Lipid panel - CBC with Differential/Platelet - TSH  2. Screening for colon cancer - Ambulatory referral to Gastroenterology  3. Screening for metabolic disorder - Hemoglobin A1c  4. Screening for prostate cancer - PSA  5. Seasonal allergic rhinitis due to other allergic trigger - fluticasone (FLONASE) 50 MCG/ACT nasal spray; Place 2 sprays into both nostrils daily.  Dispense: 16 g; Refill: 1  6. Screening for viral disease - SARS-CoV-2 Antibody, IgM  7. Elevated Blood pressure BP at [place] visit was 142/86 He declines initiation of medication and would like to work on lifestyle changes Counseled on blood pressure goal of less than 130/80, low-sodium, DASH diet, medication compliance, 150 minutes of moderate intensity exercise per week. Discussed medication compliance, adverse effects.  Prevnar 13 administered today Meds ordered this encounter  Medications  . fluticasone (FLONASE) 50 MCG/ACT nasal spray    Sig: Place 2 sprays into both nostrils daily.    Dispense:  16 g    Refill:  1    Follow-up: Return in about 3 months (around 04/01/2019) for elevated blood pressure.       Charlott Rakes, MD, FAAFP. [facility] and [facility], [place]   12/30/2018, 10:40 AM

## 2018-12-30 NOTE — Patient Instructions (Signed)
Health Maintenance After Age 67 After age 67, you are at a higher risk for certain long-term diseases and infections as well as injuries from falls. Falls are a major cause of broken bones and head injuries in people who are older than age 67. Getting regular preventive care can help to keep you healthy and well. Preventive care includes getting regular testing and making lifestyle changes as recommended by your health care provider. Talk with your health care provider about:  Which screenings and tests you should have. A screening is a test that checks for a disease when you have no symptoms.  A diet and exercise plan that is right for you. What should I know about screenings and tests to prevent falls? Screening and testing are the best ways to find a health problem early. Early diagnosis and treatment give you the best chance of managing medical conditions that are common after age 67. Certain conditions and lifestyle choices may make you more likely to have a fall. Your health care provider may recommend:  Regular vision checks. Poor vision and conditions such as cataracts can make you more likely to have a fall. If you wear glasses, make sure to get your prescription updated if your vision changes.  Medicine review. Work with your health care provider to regularly review all of the medicines you are taking, including over-the-counter medicines. Ask your health care provider about any side effects that may make you more likely to have a fall. Tell your health care provider if any medicines that you take make you feel dizzy or sleepy.  Osteoporosis screening. Osteoporosis is a condition that causes the bones to get weaker. This can make the bones weak and cause them to break more easily.  Blood pressure screening. Blood pressure changes and medicines to control blood pressure can make you feel dizzy.  Strength and balance checks. Your health care provider may recommend certain tests to check your  strength and balance while standing, walking, or changing positions.  Foot health exam. Foot pain and numbness, as well as not wearing proper footwear, can make you more likely to have a fall.  Depression screening. You may be more likely to have a fall if you have a fear of falling, feel emotionally low, or feel unable to do activities that you used to do.  Alcohol use screening. Using too much alcohol can affect your balance and may make you more likely to have a fall. What actions can I take to lower my risk of falls? General instructions  Talk with your health care provider about your risks for falling. Tell your health care provider if: ? You fall. Be sure to tell your health care provider about all falls, even ones that seem minor. ? You feel dizzy, sleepy, or off-balance.  Take over-the-counter and prescription medicines only as told by your health care provider. These include any supplements.  Eat a healthy diet and maintain a healthy weight. A healthy diet includes low-fat dairy products, low-fat (lean) meats, and fiber from whole grains, beans, and lots of fruits and vegetables. Home safety  Remove any tripping hazards, such as rugs, cords, and clutter.  Install safety equipment such as grab bars in bathrooms and safety rails on stairs.  Keep rooms and walkways well-lit. Activity   Follow a regular exercise program to stay fit. This will help you maintain your balance. Ask your health care provider what types of exercise are appropriate for you.  If you need a cane or   walker, use it as recommended by your health care provider.  Wear supportive shoes that have nonskid soles. Lifestyle  Do not drink alcohol if your health care provider tells you not to drink.  If you drink alcohol, limit how much you have: ? 0-1 drink a day for women. ? 0-2 drinks a day for men.  Be aware of how much alcohol is in your drink. In the [place] one drink equals one typical bottle of beer (12  oz), one-half glass of wine (5 oz), or one shot of hard liquor (1 oz).  Do not use any products that contain nicotine or tobacco, such as cigarettes and e-cigarettes. If you need help quitting, ask your health care provider. Summary  Having a healthy lifestyle and getting preventive care can help to protect your health and wellness after age 67.  Screening and testing are the best way to find a health problem early and help you avoid having a fall. Early diagnosis and treatment give you the best chance for managing medical conditions that are more common for people who are older than age 67.  Falls are a major cause of broken bones and head injuries in people who are older than age 67. Take precautions to prevent a fall at home.  Work with your health care provider to learn what changes you can make to improve your health and wellness and to prevent falls. This information is not intended to replace advice given to you by your health care provider. Make sure you discuss any questions you have with your health care provider. Document Released: 01/28/2017 Document Revised: 07/08/2018 Document Reviewed: 01/28/2017 Elsevier Patient Education  2020 Elsevier Inc.  

## 2018-12-30 NOTE — Progress Notes (Signed)
Patient here to get established.   Patient is fasting.  Declines flu shot.  Patient is requesting PSA, A1C & COVID antibody testing. States that he called his insurance & they will cover the antibody testing.  Has issues with seasonal allergies. Wants to know a good nasal spray to help with sinus congestion. Has been on generic Flonase before with good relief.

## 2018-12-31 LAB — LIPID PANEL
Chol/HDL Ratio: 2.9 ratio (ref 0.0–5.0)
Cholesterol, Total: 161 mg/dL (ref 100–199)
HDL: 55 mg/dL (ref >39)
LDL Chol Calc (NIH): 96 mg/dL (ref 0–99)
Triglycerides: 45 mg/dL (ref 0–149)
VLDL Cholesterol Cal: 10 mg/dL (ref 5–40)

## 2018-12-31 LAB — CBC WITH DIFFERENTIAL/PLATELET
Basophils Absolute: 0 10*3/uL (ref 0.0–0.2)
Basos: 1 %
EOS (ABSOLUTE): 0.1 10*3/uL (ref 0.0–0.4)
Eos: 3 %
Hematocrit: 44.3 % (ref 37.5–51.0)
Hemoglobin: 14.5 g/dL (ref 13.0–17.7)
Immature Grans (Abs): 0 10*3/uL (ref 0.0–0.1)
Immature Granulocytes: 0 %
Lymphocytes Absolute: 1.1 10*3/uL (ref 0.7–3.1)
Lymphs: 35 %
MCH: 28.3 pg (ref 26.6–33.0)
MCHC: 32.7 g/dL (ref 31.5–35.7)
MCV: 86 fL (ref 79–97)
Monocytes Absolute: 0.3 10*3/uL (ref 0.1–0.9)
Monocytes: 9 %
Neutrophils Absolute: 1.6 10*3/uL (ref 1.4–7.0)
Neutrophils: 52 %
Platelets: 205 10*3/uL (ref 150–450)
RBC: 5.13 x10E6/uL (ref 4.14–5.80)
RDW: 13.7 % (ref 11.6–15.4)
WBC: 3 10*3/uL — ABNORMAL LOW (ref 3.4–10.8)

## 2018-12-31 LAB — CMP14+EGFR
ALT: 19 IU/L (ref 0–44)
AST: 21 IU/L (ref 0–40)
Albumin/Globulin Ratio: 2.2 (ref 1.2–2.2)
Albumin: 4.7 g/dL (ref 3.8–4.8)
Alkaline Phosphatase: 62 IU/L (ref 39–117)
BUN/Creatinine Ratio: 13 (ref 10–24)
BUN: 14 mg/dL (ref 8–27)
Bilirubin Total: 0.7 mg/dL (ref 0.0–1.2)
CO2: 26 mmol/L (ref 20–29)
Calcium: 10.6 mg/dL — ABNORMAL HIGH (ref 8.6–10.2)
Chloride: 104 mmol/L (ref 96–106)
Creatinine, Ser: 1.04 mg/dL (ref 0.76–1.27)
GFR calc Af Amer: 86 mL/min/{1.73_m2} (ref >59)
GFR calc non Af Amer: 74 mL/min/{1.73_m2} (ref >59)
Globulin, Total: 2.1 g/dL (ref 1.5–4.5)
Glucose: 96 mg/dL (ref 65–99)
Potassium: 4.9 mmol/L (ref 3.5–5.2)
Sodium: 139 mmol/L (ref 134–144)
Total Protein: 6.8 g/dL (ref 6.0–8.5)

## 2018-12-31 LAB — TSH: TSH: 1.49 u[IU]/mL (ref 0.450–4.500)

## 2018-12-31 LAB — SARS-COV-2 ANTIBODY, IGM: SARS-CoV-2 Antibody, IgM: NEGATIVE

## 2018-12-31 LAB — HEMOGLOBIN A1C
Est. average glucose Bld gHb Est-mCnc: 103 mg/dL
Hgb A1c MFr Bld: 5.2 % (ref 4.8–5.6)

## 2018-12-31 LAB — PSA: Prostate Specific Ag, Serum: 3.5 ng/mL (ref 0.0–4.0)

## 2019-01-04 DIAGNOSIS — R350 Frequency of micturition: Secondary | ICD-10-CM | POA: Diagnosis not present

## 2019-01-04 DIAGNOSIS — Z125 Encounter for screening for malignant neoplasm of prostate: Secondary | ICD-10-CM | POA: Diagnosis not present

## 2019-01-04 DIAGNOSIS — N5201 Erectile dysfunction due to arterial insufficiency: Secondary | ICD-10-CM | POA: Diagnosis not present

## 2019-01-31 ENCOUNTER — Encounter: Payer: Self-pay | Admitting: Family Medicine

## 2019-04-06 ENCOUNTER — Telehealth: Payer: Self-pay

## 2019-04-06 NOTE — Telephone Encounter (Signed)

## 2019-04-07 ENCOUNTER — Ambulatory Visit (INDEPENDENT_AMBULATORY_CARE_PROVIDER_SITE_OTHER): Payer: Federal, State, Local not specified - PPO | Admitting: Family Medicine

## 2019-04-07 ENCOUNTER — Other Ambulatory Visit: Payer: Self-pay

## 2019-04-07 VITALS — BP 158/97 | HR 71 | Temp 97.3°F | Resp 16 | Wt 180.0 lb

## 2019-04-07 DIAGNOSIS — I10 Essential (primary) hypertension: Secondary | ICD-10-CM | POA: Diagnosis not present

## 2019-04-07 MED ORDER — AMLODIPINE BESYLATE 2.5 MG PO TABS: 2.5000 mg | ORAL_TABLET | Freq: Every day | ORAL | 1 refills | Status: DC

## 2019-04-07 NOTE — Progress Notes (Signed)
Subjective:  Patient ID: Leonard Bryant, male    DOB: [birth year]  Age: 69 y.o. MRN: [id number]  CC: Hypertension (states that home readings have been in the 120s-130s/70s-80s. denies chest pain, SHOB, palpitations, dizziness.)   HPI Leonard Bryant presents for follow-up of his hypertension and blood pressure is 158/97, at his last office visit it was 153/89. Blood Pressure is usually controlled at home, systolic BP was [id number] the last time he checked. He is not excited about commencing an antihypertensive and would like to stay away from this if he can. He has no additional concerns today.  Past Medical History:  Diagnosis Date  . Allergy    congestion  . 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 Hyperlipidemia    Was borderline in the past    Past Surgical History:  Procedure Laterality Date  . HERNIA REPAIR     Femoral?    Family History  Problem Relation Age of Onset  . Diabetes Mother   . Heart disease Mother   . Stroke Mother     Allergies  Allergen Reactions  . Pseudoephedrine Other (See Comments)    Testicular pain and tenderness  . Penicillins Rash    Has patient had a PCN reaction causing immediate rash, facial/tongue/throat swelling, SOB or lightheadedness with hypotension: No Has patient had a PCN reaction causing severe rash involving mucus membranes or skin necrosis: No Has patient had a PCN reaction that required hospitalization No Has patient had a PCN reaction occurring within the last 10 years: No If all of the above answers are "NO", then may proceed with Cephalosporin use.     Outpatient Medications Prior to Visit  Medication Sig Dispense Refill  . aspirin EC 81 MG tablet Take 81 mg by mouth daily.    . Multiple Vitamin (MULTIVITAMIN) tablet Take 1 tablet by mouth daily.    . fluticasone (FLONASE) 50 MCG/ACT nasal spray Place 2 sprays into both nostrils daily. 16 g 1   No facility-administered medications prior to visit.     ROS Review of Systems   Constitutional: Negative for activity change and appetite change.  HENT: Negative for sinus pressure and sore throat.   Eyes: Negative for visual disturbance.  Respiratory: Negative for cough, chest tightness and shortness of breath.   Cardiovascular: Negative for chest pain and leg swelling.  Gastrointestinal: Negative for abdominal distention, abdominal pain, constipation and diarrhea.  Endocrine: Negative.   Genitourinary: Negative for dysuria.  Musculoskeletal: Negative for joint swelling and myalgias.  Skin: Negative for rash.  Allergic/Immunologic: Negative.   Neurological: Negative for weakness, light-headedness and numbness.  Psychiatric/Behavioral: Negative for dysphoric mood and suicidal ideas.    Objective:  BP (!) 161/96   Pulse 71   Temp (!) 97.3 F (36.3 C) (Temporal)   Resp 16   Wt 180 lb (81.6 kg)   SpO2 98%   BMI 29.05 kg/m   BP/Weight 04/07/2019 [id number]  Systolic BP [id number]  Diastolic BP 96 89 95  Wt. (Lbs) 180 174.8 -  BMI 29.05 28.21 -      Physical Exam Constitutional:      Appearance: He is well-developed.  Neck:     Vascular: No JVD.  Cardiovascular:     Rate and Rhythm: Normal rate.     Heart sounds: Normal heart sounds. No murmur.  Pulmonary:     Effort: Pulmonary effort is normal.     Breath sounds: Normal breath sounds. No wheezing  or rales.  Chest:     Chest wall: No tenderness.  Abdominal:     General: Bowel sounds are normal. There is no distension.     Palpations: Abdomen is soft. There is no mass.     Tenderness: There is no abdominal tenderness.  Musculoskeletal:        General: Normal range of motion.     Right lower leg: No edema.     Left lower leg: No edema.  Neurological:     Mental Status: He is alert and oriented to person, place, and time.  Psychiatric:        Mood and Affect: Mood normal.     CMP Latest Ref Rng & Units 12/30/2018 03/18/2017 01/02/2016  Glucose 65 - 99 mg/dL 96 103(H) 99  BUN 8 - 27  mg/dL 14 17 20   Creatinine 0.76 - 1.27 mg/dL 1.04 1.04 0.96  Sodium 134 - 144 mmol/L 139 143 141  Potassium 3.5 - 5.2 mmol/L 4.9 4.9 4.6  Chloride 96 - 106 mmol/L 104 107(H) 109  CO2 20 - 29 mmol/L 26 23 26   Calcium 8.6 - 10.2 mg/dL 10.6(H) 10.3(H) 10.1  Total Protein 6.0 - 8.5 g/dL 6.8 7.1 6.7  Total Bilirubin 0.0 - 1.2 mg/dL 0.7 0.5 0.6  Alkaline Phos 39 - 117 IU/L 62 60 41  AST 0 - 40 IU/L 21 33 22  ALT 0 - 44 IU/L 19 29 18     Lipid Panel     Component Value Date/Time   CHOL 161 12/30/2018 1044   TRIG 45 12/30/2018 1044   HDL 55 12/30/2018 1044   CHOLHDL 2.9 12/30/2018 1044   CHOLHDL 3.3 03/15/2014 1113   VLDL 15 03/15/2014 1113   LDLCALC 96 12/30/2018 1044   LDLDIRECT 106 (H) 05/05/2012 1032    CBC    Component Value Date/Time   WBC 3.0 (L) 12/30/2018 1044   WBC 2.8 (L) 01/02/2016 0930   RBC 5.13 12/30/2018 1044   RBC 5.05 01/02/2016 0930   HGB 14.5 12/30/2018 1044   HCT 44.3 12/30/2018 1044   PLT 205 12/30/2018 1044   MCV 86 12/30/2018 1044   MCH 28.3 12/30/2018 1044   MCH 28.9 01/02/2016 0930   MCHC 32.7 12/30/2018 1044   MCHC 35.4 01/02/2016 0930   RDW 13.7 12/30/2018 1044   LYMPHSABS 1.1 12/30/2018 1044   MONOABS 0.3 12/25/2012 0318   EOSABS 0.1 12/30/2018 1044   BASOSABS 0.0 12/30/2018 1044    Lab Results  Component Value Date   HGBA1C 5.2 12/30/2018    Assessment & Plan:    1. Essential hypertension New diagnosis Based on elevated blood pressures at several office visits we will commence low-dose amlodipine after joint decision making He will keep a blood pressure log at home and work on lifestyle modifications. Informed that in the future should he start to experience hypertension we can always discontinue amlodipine. Counseled on blood pressure goal of less than 130/80, low-sodium, DASH diet, medication compliance, 150 minutes of moderate intensity exercise per week. Discussed medication compliance, adverse effects. - amLODipine (NORVASC)  2.5 MG tablet; Take 1 tablet (2.5 mg total) by mouth daily.  Dispense: 90 tablet; Refill: 1     Charlott Rakes, MD, FAAFP. [facility] and [facility] [place], [place]   04/07/2019, 10:30 AM

## 2019-04-07 NOTE — Patient Instructions (Signed)
Amlodipine Oral Tablets What is this medicine? AMLODIPINE (am LOE di peen) is a calcium channel blocker. It relaxes your blood vessels and decreases the amount of work the heart has to do. It treats high blood pressure and/or prevents chest pain (also called angina). This medicine may be used for other purposes; ask your health care provider or pharmacist if you have questions. COMMON BRAND NAME(S): Norvasc What should I tell my health care provider before I take this medicine? They need to know if you have any of these conditions:  heart disease  liver disease  an unusual or allergic reaction to amlodipine, other drugs, foods, dyes, or preservatives  pregnant or trying to get pregnant  breast-feeding How should I use this medicine? Take this drug by mouth. Take it as directed on the prescription label at the same time every day. You can take it with or without food. If it upsets your stomach, take it with food. Keep taking it unless your health care provider tells you to stop. Talk to your health care provider about the use of this drug in children. While it may be prescribed for children as young as 6 for selected conditions, precautions do apply. Overdosage: If you think you have taken too much of this medicine contact a poison control center or emergency room at once. NOTE: This medicine is only for you. Do not share this medicine with others. What if I miss a dose? If you miss a dose, take it as soon as you can. If it is almost time for your next dose, take only that dose. Do not take double or extra doses. What may interact with this medicine? This medicine may interact with the following medications:  clarithromycin  cyclosporine  diltiazem  itraconazole  simvastatin  tacrolimus This list may not describe all possible interactions. Give your health care provider a list of all the medicines, herbs, non-prescription drugs, or dietary supplements you use. Also tell them if  you smoke, drink alcohol, or use illegal drugs. Some items may interact with your medicine. What should I watch for while using this medicine? Visit your health care provider for regular checks on your progress. Check your blood pressure as directed. Ask your health care provider what your blood pressure should be. Also, find out when you should contact him or her. Do not treat yourself for coughs, colds, or pain while you are using this drug without asking your health care provider for advice. Some drugs may increase your blood pressure. You may get drowsy or dizzy. Do not drive, use machinery, or do anything that needs mental alertness until you know how this drug affects you. Do not stand up or sit up quickly, especially if you are an older patient. This reduces the risk of dizzy or fainting spells. What side effects may I notice from receiving this medicine? Side effects that you should report to your doctor or health care provider as soon as possible:  allergic reactions (skin rash, itching or hives; swelling of the face, lips, or tongue)  heart attack (trouble breathing; pain or tightness in the chest, neck, back or arms; unusually weak or tired)  low blood pressure (dizziness; feeling faint or lightheaded, falls; unusually weak or tired) Side effects that usually do not require medical attention (report these to your doctor or health care provider if they continue or are bothersome):  facial flushing  nausea  palpitations  stomach pain  sudden weight gain  swelling of the ankles, feet, hands   This list may not describe all possible side effects. Call your doctor for medical advice about side effects. You may report side effects to FDA at 1-800-FDA-1088. Where should I keep my medicine? Keep out of the reach of children and pets. Store at room temperature between 59 and 86 degrees F (15 and 30 degrees C). Protect from light and moisture. Keep the container tightly closed. Throw away  any unused drug after the expiration date. NOTE: This sheet is a summary. It may not cover all possible information. If you have questions about this medicine, talk to your doctor, pharmacist, or health care provider.  2020 Elsevier/Gold Standard (2018-12-21 19:39:45)  

## 2019-04-19 ENCOUNTER — Other Ambulatory Visit: Payer: Self-pay | Admitting: Family Medicine

## 2019-04-19 DIAGNOSIS — J3089 Other allergic rhinitis: Secondary | ICD-10-CM

## 2019-07-05 NOTE — Patient Instructions (Addendum)
DASH Eating Plan DASH stands for "Dietary Approaches to Stop Hypertension." The DASH eating plan is a healthy eating plan that has been shown to reduce high blood pressure (hypertension). It may also reduce your risk for type 2 diabetes, heart disease, and stroke. The DASH eating plan may also help with weight loss. What are tips for following this plan?  General guidelines  Avoid eating more than 2,300 mg (milligrams) of salt (sodium) a day. If you have hypertension, you may need to reduce your sodium intake to 1,500 mg a day.  Limit alcohol intake to no more than 1 drink a day for nonpregnant women and 2 drinks a day for men. One drink equals 12 oz of beer, 5 oz of wine, or 1 oz of hard liquor.  Work with your health care provider to maintain a healthy body weight or to lose weight. Ask what an ideal weight is for you.  Get at least 30 minutes of exercise that causes your heart to beat faster (aerobic exercise) most days of the week. Activities may include walking, swimming, or biking.  Work with your health care provider or diet and nutrition specialist (dietitian) to adjust your eating plan to your individual calorie needs. Reading food labels   Check food labels for the amount of sodium per serving. Choose foods with less than 5 percent of the Daily Value of sodium. Generally, foods with less than 300 mg of sodium per serving fit into this eating plan.  To find whole grains, look for the word "whole" as the first word in the ingredient list. Shopping  Buy products labeled as "low-sodium" or "no salt added."  Buy fresh foods. Avoid canned foods and premade or frozen meals. Cooking  Avoid adding salt when cooking. Use salt-free seasonings or herbs instead of table salt or sea salt. Check with your health care provider or pharmacist before using salt substitutes.  Do not fry foods. Cook foods using healthy methods such as baking, boiling, grilling, and broiling instead.  Cook with  heart-healthy oils, such as olive, canola, soybean, or sunflower oil. Meal planning  Eat a balanced diet that includes: ? 5 or more servings of fruits and vegetables each day. At each meal, try to fill half of your plate with fruits and vegetables. ? Up to 6-8 servings of whole grains each day. ? Less than 6 oz of lean meat, poultry, or fish each day. A 3-oz serving of meat is about the same size as a deck of cards. One egg equals 1 oz. ? 2 servings of low-fat dairy each day. ? A serving of nuts, seeds, or beans 5 times each week. ? Heart-healthy fats. Healthy fats called Omega-3 fatty acids are found in foods such as flaxseeds and coldwater fish, like sardines, salmon, and mackerel.  Limit how much you eat of the following: ? Canned or prepackaged foods. ? Food that is high in trans fat, such as fried foods. ? Food that is high in saturated fat, such as fatty meat. ? Sweets, desserts, sugary drinks, and other foods with added sugar. ? Full-fat dairy products.  Do not salt foods before eating.  Try to eat at least 2 vegetarian meals each week.  Eat more home-cooked food and less restaurant, buffet, and fast food.  When eating at a restaurant, ask that your food be prepared with less salt or no salt, if possible. What foods are recommended? The items listed may not be a complete list. Talk with your dietitian about   what dietary choices are best for you. Grains Whole-grain or whole-wheat bread. Whole-grain or whole-wheat pasta. Brown rice. Oatmeal. Quinoa. Bulgur. Whole-grain and low-sodium cereals. Pita bread. Low-fat, low-sodium crackers. Whole-wheat flour tortillas. Vegetables Fresh or frozen vegetables (raw, steamed, roasted, or grilled). Low-sodium or reduced-sodium tomato and vegetable juice. Low-sodium or reduced-sodium tomato sauce and tomato paste. Low-sodium or reduced-sodium canned vegetables. Fruits All fresh, dried, or frozen fruit. Canned fruit in natural juice (without  added sugar). Meat and other protein foods Skinless chicken or turkey. Ground chicken or turkey. Pork with fat trimmed off. Fish and seafood. Egg whites. Dried beans, peas, or lentils. Unsalted nuts, nut butters, and seeds. Unsalted canned beans. Lean cuts of beef with fat trimmed off. Low-sodium, lean deli meat. Dairy Low-fat (1%) or fat-free (skim) milk. Fat-free, low-fat, or reduced-fat cheeses. Nonfat, low-sodium ricotta or cottage cheese. Low-fat or nonfat yogurt. Low-fat, low-sodium cheese. Fats and oils Soft margarine without trans fats. Vegetable oil. Low-fat, reduced-fat, or light mayonnaise and salad dressings (reduced-sodium). Canola, safflower, olive, soybean, and sunflower oils. Avocado. Seasoning and other foods Herbs. Spices. Seasoning mixes without salt. Unsalted popcorn and pretzels. Fat-free sweets. What foods are not recommended? The items listed may not be a complete list. Talk with your dietitian about what dietary choices are best for you. Grains Baked goods made with fat, such as croissants, muffins, or some breads. Dry pasta or rice meal packs. Vegetables Creamed or fried vegetables. Vegetables in a cheese sauce. Regular canned vegetables (not low-sodium or reduced-sodium). Regular canned tomato sauce and paste (not low-sodium or reduced-sodium). Regular tomato and vegetable juice (not low-sodium or reduced-sodium). Pickles. Olives. Fruits Canned fruit in a light or heavy syrup. Fried fruit. Fruit in cream or butter sauce. Meat and other protein foods Fatty cuts of meat. Ribs. Fried meat. Bacon. Sausage. Bologna and other processed lunch meats. Salami. Fatback. Hotdogs. Bratwurst. Salted nuts and seeds. Canned beans with added salt. Canned or smoked fish. Whole eggs or egg yolks. Chicken or turkey with skin. Dairy Whole or 2% milk, cream, and half-and-half. Whole or full-fat cream cheese. Whole-fat or sweetened yogurt. Full-fat cheese. Nondairy creamers. Whipped toppings.  Processed cheese and cheese spreads. Fats and oils Butter. Stick margarine. Lard. Shortening. Ghee. Bacon fat. Tropical oils, such as coconut, palm kernel, or palm oil. Seasoning and other foods Salted popcorn and pretzels. Onion salt, garlic salt, seasoned salt, table salt, and sea salt. Worcestershire sauce. Tartar sauce. Barbecue sauce. Teriyaki sauce. Soy sauce, including reduced-sodium. Steak sauce. Canned and packaged gravies. Fish sauce. Oyster sauce. Cocktail sauce. Horseradish that you find on the shelf. Ketchup. Mustard. Meat flavorings and tenderizers. Bouillon cubes. Hot sauce and Tabasco sauce. Premade or packaged marinades. Premade or packaged taco seasonings. Relishes. Regular salad dressings. Where to find more information:  National Heart, Lung, and Blood Institute: www.nhlbi.nih.gov  American Heart Association: www.heart.org Summary  The DASH eating plan is a healthy eating plan that has been shown to reduce high blood pressure (hypertension). It may also reduce your risk for type 2 diabetes, heart disease, and stroke.  With the DASH eating plan, you should limit salt (sodium) intake to 2,300 mg a day. If you have hypertension, you may need to reduce your sodium intake to 1,500 mg a day.  When on the DASH eating plan, aim to eat more fresh fruits and vegetables, whole grains, lean proteins, low-fat dairy, and heart-healthy fats.  Work with your health care provider or diet and nutrition specialist (dietitian) to adjust your eating plan to your   individual calorie needs. This information is not intended to replace advice given to you by your health care provider. Make sure you discuss any questions you have with your health care provider. Document Revised: 02/27/2017 Document Reviewed: 03/10/2016 Elsevier Patient Education  2020 [place] you for choosing Primary Care at [facility] to be your medical home!    Leonard Bryant was seen by Melina Schools, DO today.    Domingo Dimes primary care provider is Phill Myron, DO.   For the best care possible, you should try to see Phill Myron, DO whenever you come to the clinic.   We look forward to seeing you again soon!  If you have any questions about your visit today, please call us at [phone] or feel free to reach your primary care provider via [place].

## 2019-07-06 ENCOUNTER — Encounter: Payer: Self-pay | Admitting: Gastroenterology

## 2019-07-06 ENCOUNTER — Ambulatory Visit (INDEPENDENT_AMBULATORY_CARE_PROVIDER_SITE_OTHER): Payer: Federal, State, Local not specified - PPO | Admitting: Internal Medicine

## 2019-07-06 ENCOUNTER — Encounter: Payer: Self-pay | Admitting: Internal Medicine

## 2019-07-06 ENCOUNTER — Telehealth: Payer: Federal, State, Local not specified - PPO | Admitting: Internal Medicine

## 2019-07-06 ENCOUNTER — Other Ambulatory Visit: Payer: Self-pay

## 2019-07-06 VITALS — HR 67 | Temp 97.3°F | Resp 17 | Ht 66.5 in | Wt 181.0 lb

## 2019-07-06 DIAGNOSIS — I10 Essential (primary) hypertension: Secondary | ICD-10-CM | POA: Diagnosis not present

## 2019-07-06 DIAGNOSIS — Z1211 Encounter for screening for malignant neoplasm of colon: Secondary | ICD-10-CM | POA: Diagnosis not present

## 2019-07-06 MED ORDER — AMLODIPINE BESYLATE 5 MG PO TABS: 5.0000 mg | ORAL_TABLET | Freq: Every day | ORAL | 3 refills | Status: DC

## 2019-07-06 NOTE — Progress Notes (Signed)
  Subjective:    Leonard Bryant - 69 y.o. male MRN [id number]  Date of birth: [birth year]  HPI  SAVVA ADINOLFI is here for HTN f/u.  Chronic HTN: New Diagnosis---diagnosed in Jan 2021.  Disease Monitoring:  Home BP Monitoring - Yes. Reports he was monitoring daily when he was first diagnosed and numbers have been 130s/70s.  Chest pain- no  Dyspnea- no Headache - no  Medications: Amlodipine 2.5 mg daily  Compliance- yes Lightheadedness- no  Edema- no     Health Maintenance:  Health Maintenance Due  Topic Date Due  . COLONOSCOPY  Never done    -  reports that he has quit smoking. His smoking use included cigarettes. He has a 7.50 pack-year smoking history. He has never used smokeless tobacco. - Review of Systems: Per HPI. - Past Medical History: Patient Active Problem List   Diagnosis Date Noted  . Essential hypertension 07/06/2019  . Refusal of blood transfusions as patient is Jehovah's Witness 05/23/2014  . BPH (benign prostatic hyperplasia) 03/15/2014  . Erectile dysfunction 03/15/2014  . Leukopenia 05/06/2012  . Allergic rhinitis 05/06/2012  . Hyperlipidemia    - Medications: reviewed and updated   Objective:   Physical Exam Temp (!) 97.3 F (36.3 C) (Temporal)   Resp 17   Ht 5' 6.5" (1.689 m)   Wt 181 lb (82.1 kg)   BMI 28.78 kg/m  Physical Exam  Constitutional: He is oriented to person, place, and time and well-developed, well-nourished, and in no distress. No distress.  HENT:  Head: Normocephalic and atraumatic.  Eyes: Conjunctivae and EOM are normal.  Cardiovascular: Normal rate and regular rhythm.  Pulmonary/Chest: Effort normal and breath sounds normal. No respiratory distress.  Musculoskeletal:        General: Normal range of motion.  Neurological: He is alert and oriented to person, place, and time.  Skin: Skin is warm and dry. He is not diaphoretic.  Psychiatric: Affect and judgment normal.           Assessment & Plan:   1.  Essential hypertension BP above goal. Will increase Amlodipine to 5 mg daily. Significant difference between patient's readings and our readings in the office. Will have him return for 2 week BP check with new dose of medication and at that time will have him bring his cuff for comparison.  Counseled on blood pressure goal of less than 130/80, low-sodium, DASH diet, medication compliance, 150 minutes of moderate intensity exercise per week. Discussed medication compliance, adverse effects. - amLODipine (NORVASC) 5 MG tablet; Take 1 tablet (5 mg total) by mouth daily.  Dispense: 90 tablet; Refill: 3  2. Screening for colon cancer - Ambulatory referral to Gastroenterology     Phill Myron, D.O. 07/06/2019, 9:41 AM Primary Care at [facility]

## 2019-07-20 ENCOUNTER — Ambulatory Visit (INDEPENDENT_AMBULATORY_CARE_PROVIDER_SITE_OTHER): Payer: Federal, State, Local not specified - PPO

## 2019-07-20 ENCOUNTER — Other Ambulatory Visit: Payer: Self-pay

## 2019-07-20 VITALS — BP 134/86 | HR 78

## 2019-07-20 DIAGNOSIS — I10 Essential (primary) hypertension: Secondary | ICD-10-CM

## 2019-07-20 NOTE — Progress Notes (Signed)
Patient here for BP check. Patient has After letting patient sit BP was 134/86, pulse was 78. Spoke with provider who states to continue current medications & follow up in 3-4 months. KWalker, CMA.

## 2019-08-03 ENCOUNTER — Ambulatory Visit (AMBULATORY_SURGERY_CENTER): Payer: Self-pay | Admitting: *Deleted

## 2019-08-03 ENCOUNTER — Other Ambulatory Visit: Payer: Self-pay

## 2019-08-03 VITALS — Temp 97.4°F | Ht 68.0 in | Wt 179.4 lb

## 2019-08-03 DIAGNOSIS — Z1211 Encounter for screening for malignant neoplasm of colon: Secondary | ICD-10-CM

## 2019-08-03 DIAGNOSIS — Z01818 Encounter for other preprocedural examination: Secondary | ICD-10-CM

## 2019-08-03 MED ORDER — SUPREP BOWEL PREP KIT 17.5-3.13-1.6 GM/177ML PO SOLN: ORAL | 0 refills | Status: DC

## 2019-08-03 NOTE — Progress Notes (Signed)
Pt states he had his last colonoscopy "about 15 years ago but am not exactly sure where"  No blood products per pt  covid test 08-12-19 at 8:10 am  Pt is aware that care partner will wait in the car during procedure; if they feel like they will be too hot or cold to wait in the car; they may wait in the 4 th floor lobby. Patient is aware to bring only one care partner. We want them to wear a mask (we do not have any that we can provide them), practice social distancing, and we will check their temperatures when they get here.  I did remind the patient that their care partner needs to stay in the parking lot the entire time and have a cell phone available, we will call them when the pt is ready for discharge. Patient will wear mask into building.   No trouble with anesthesia, difficulty with intubation or hx/fam hx of malignant hyperthermia per pt   No egg or soy allergy  No home oxygen use   No medications for weight loss taken  emmi information given  Pt denies constipation issues- takes Fiber daily and keeps him regular per pt  Suprep coupon given and code put into computer

## 2019-08-12 ENCOUNTER — Ambulatory Visit (INDEPENDENT_AMBULATORY_CARE_PROVIDER_SITE_OTHER): Payer: Federal, State, Local not specified - PPO

## 2019-08-12 ENCOUNTER — Other Ambulatory Visit: Payer: Self-pay

## 2019-08-12 ENCOUNTER — Other Ambulatory Visit: Payer: Self-pay | Admitting: Gastroenterology

## 2019-08-12 DIAGNOSIS — Z1159 Encounter for screening for other viral diseases: Secondary | ICD-10-CM | POA: Diagnosis not present

## 2019-08-12 LAB — SARS CORONAVIRUS 2 (TAT 6-24 HRS): SARS Coronavirus 2: NEGATIVE

## 2019-08-17 ENCOUNTER — Ambulatory Visit (AMBULATORY_SURGERY_CENTER): Payer: Federal, State, Local not specified - PPO | Admitting: Gastroenterology

## 2019-08-17 ENCOUNTER — Encounter: Payer: Self-pay | Admitting: Gastroenterology

## 2019-08-17 ENCOUNTER — Other Ambulatory Visit: Payer: Self-pay

## 2019-08-17 VITALS — BP 108/86 | HR 67 | Temp 95.1°F | Resp 11 | Ht 68.0 in | Wt 179.0 lb

## 2019-08-17 DIAGNOSIS — D124 Benign neoplasm of descending colon: Secondary | ICD-10-CM | POA: Diagnosis not present

## 2019-08-17 DIAGNOSIS — D122 Benign neoplasm of ascending colon: Secondary | ICD-10-CM

## 2019-08-17 DIAGNOSIS — D123 Benign neoplasm of transverse colon: Secondary | ICD-10-CM

## 2019-08-17 DIAGNOSIS — Z1211 Encounter for screening for malignant neoplasm of colon: Secondary | ICD-10-CM

## 2019-08-17 MED ORDER — SODIUM CHLORIDE 0.9 % IV SOLN: 500.0000 mL | Freq: Once | INTRAVENOUS | Status: DC

## 2019-08-17 NOTE — Progress Notes (Signed)
Called to room to assist during endoscopic procedure.  Patient ID and intended procedure confirmed with present staff. Received instructions for my participation in the procedure from the performing physician.  

## 2019-08-17 NOTE — Op Note (Signed)
[facility] Patient Name: Leonard Bryant Procedure Date: 08/17/2019 9:23 AM MRN: [id number] Endoscopist: Ladene Artist , MD Age: 69 Referring MD:  Date of Birth: 1950/06/16 Gender: Male Account #: [id number] Procedure:                Colonoscopy Indications:              Screening for colorectal malignant neoplasm Medicines:                Monitored Anesthesia Care Procedure:                Pre-Anesthesia Assessment:                           - Prior to the procedure, a History and Physical                            was performed, and patient medications and                            allergies were reviewed. The patient's tolerance of                            previous anesthesia was also reviewed. The risks                            and benefits of the procedure and the sedation                            options and risks were discussed with the patient.                            All questions were answered, and informed consent                            was obtained. Prior Anticoagulants: The patient has                            taken no previous anticoagulant or antiplatelet                            agents. ASA Grade Assessment: II - A patient with                            mild systemic disease. After reviewing the risks                            and benefits, the patient was deemed in                            satisfactory condition to undergo the procedure.                           After obtaining informed consent, the colonoscope  was passed under direct vision. Throughout the                            procedure, the patient's blood pressure, pulse, and                            oxygen saturations were monitored continuously. The                            Colonoscope was introduced through the anus and                            advanced to the the cecum, identified by                            appendiceal orifice and ileocecal  valve. The                            ileocecal valve, appendiceal orifice, and rectum                            were photographed. The quality of the bowel                            preparation was excellent. The colonoscopy was                            performed without difficulty. The patient tolerated                            the procedure well. Scope In: 9:34:49 AM Scope Out: 9:50:35 AM Scope Withdrawal Time: 0 hours 13 minutes 10 seconds  Total Procedure Duration: 0 hours 15 minutes 46 seconds  Findings:                 The perianal and digital rectal examinations were                            normal.                           Five sessile polyps were found in the descending                            colon (2), transverse colon (1) and ascending colon                            (2). The polyps were 5 to 8 mm in size. These                            polyps were removed with a cold snare. Resection                            and retrieval were complete.  A few medium-mouthed diverticula were found in the                            left colon. There was no evidence of diverticular                            bleeding.                           Internal hemorrhoids were found during                            retroflexion. The hemorrhoids were medium-sized and                            Grade I (internal hemorrhoids that do not prolapse).                           The exam was otherwise without abnormality on                            direct and retroflexion views. Complications:            No immediate complications. Estimated blood loss:                            None. Estimated Blood Loss:     Estimated blood loss: none. Impression:               - Five 5 to 8 mm polyps in the descending colon, in                            the transverse colon and in the ascending colon,                            removed with a cold snare. Resected and  retrieved.                           - Mild diverticulosis in the left colon.                           - Internal hemorrhoids.                           - The examination was otherwise normal on direct                            and retroflexion views. Recommendation:           - Repeat colonoscopy after studies are complete for                            surveillance based on pathology results.                           - Patient has a contact number available for  emergencies. The signs and symptoms of potential                            delayed complications were discussed with the                            patient. Return to normal activities tomorrow.                            Written discharge instructions were provided to the                            patient.                           - Resume previous diet.                           - Continue present medications.                           - Await pathology results. Ladene Artist, MD 08/17/2019 9:53:43 AM This report has been signed electronically.

## 2019-08-17 NOTE — Patient Instructions (Signed)
Please read handouts provided. Continue present medications. Await pathology results.   YOU HAD AN ENDOSCOPIC PROCEDURE TODAY AT THE [facility]:   Refer to the procedure report that was given to you for any specific questions about what was found during the examination.  If the procedure report does not answer your questions, please call your gastroenterologist to clarify.  If you requested that your care partner not be given the details of your procedure findings, then the procedure report has been included in a sealed envelope for you to review at your convenience later.  YOU SHOULD EXPECT: Some feelings of bloating in the abdomen. Passage of more gas than usual.  Walking can help get rid of the air that was put into your GI tract during the procedure and reduce the bloating. If you had a lower endoscopy (such as a colonoscopy or flexible sigmoidoscopy) you may notice spotting of blood in your stool or on the toilet paper. If you underwent a bowel prep for your procedure, you may not have a normal bowel movement for a few days.  Please Note:  You might notice some irritation and congestion in your nose or some drainage.  This is from the oxygen used during your procedure.  There is no need for concern and it should clear up in a day or so.  SYMPTOMS TO REPORT IMMEDIATELY:  Following lower endoscopy (colonoscopy or flexible sigmoidoscopy):  Excessive amounts of blood in the stool  Significant tenderness or worsening of abdominal pains  Swelling of the abdomen that is new, acute  Fever of 100F or higher   For urgent or emergent issues, a gastroenterologist can be reached at any hour by calling [phone]. Do not use MyChart messaging for urgent concerns.    DIET:  We do recommend a small meal at first, but then you may proceed to your regular diet.  Drink plenty of fluids but you should avoid alcoholic beverages for 24 hours.  ACTIVITY:  You should plan to take it easy  for the rest of today and you should NOT DRIVE or use heavy machinery until tomorrow (because of the sedation medicines used during the test).    FOLLOW UP: Our staff will call the number listed on your records 48-72 hours following your procedure to check on you and address any questions or concerns that you may have regarding the information given to you following your procedure. If we do not reach you, we will leave a message.  We will attempt to reach you two times.  During this call, we will ask if you have developed any symptoms of COVID 19. If you develop any symptoms (ie: fever, flu-like symptoms, shortness of breath, cough etc.) before then, please call [phone].  If you test positive for Covid 19 in the 2 weeks post procedure, please call and report this information to us.    If any biopsies were taken you will be contacted by phone or by letter within the next 1-3 weeks.  Please call us at [phone] if you have not heard about the biopsies in 3 weeks.    SIGNATURES/CONFIDENTIALITY: You and/or your care partner have signed paperwork which will be entered into your electronic medical record.  These signatures attest to the fact that that the information above on your After Visit Summary has been reviewed and is understood.  Full responsibility of the confidentiality of this discharge information lies with you and/or your care-partner.  

## 2019-08-17 NOTE — Progress Notes (Signed)
Report given to PACU, vss 

## 2019-08-17 NOTE — Progress Notes (Signed)
Vitals-DT Temp-JB  Pt's states no medical or surgical changes since previsit or office visit. 

## 2019-08-19 ENCOUNTER — Telehealth: Payer: Self-pay | Admitting: *Deleted

## 2019-08-19 NOTE — Telephone Encounter (Signed)
First attempt, left VM.  

## 2019-08-19 NOTE — Telephone Encounter (Signed)
Message left

## 2019-08-29 ENCOUNTER — Encounter: Payer: Self-pay | Admitting: Gastroenterology

## 2019-11-21 ENCOUNTER — Telehealth: Payer: Federal, State, Local not specified - PPO | Admitting: Internal Medicine

## 2019-11-21 DIAGNOSIS — I10 Essential (primary) hypertension: Secondary | ICD-10-CM

## 2020-01-13 ENCOUNTER — Other Ambulatory Visit: Payer: Self-pay

## 2020-01-13 DIAGNOSIS — J3089 Other allergic rhinitis: Secondary | ICD-10-CM

## 2020-01-13 MED ORDER — FLUTICASONE PROPIONATE 50 MCG/ACT NA SUSP: NASAL | 1 refills | Status: DC

## 2020-03-05 DIAGNOSIS — H401121 Primary open-angle glaucoma, left eye, mild stage: Secondary | ICD-10-CM | POA: Diagnosis not present

## 2020-03-19 ENCOUNTER — Telehealth: Payer: Federal, State, Local not specified - PPO | Admitting: Internal Medicine

## 2020-04-09 DIAGNOSIS — Z20822 Contact with and (suspected) exposure to covid-19: Secondary | ICD-10-CM | POA: Diagnosis not present

## 2020-04-11 ENCOUNTER — Telehealth: Payer: Self-pay

## 2020-04-11 NOTE — Telephone Encounter (Signed)
Att to contact pt to schedule appt per pt request.. No ans lvm. If pt returns call please schedule appt

## 2020-04-12 ENCOUNTER — Telehealth (INDEPENDENT_AMBULATORY_CARE_PROVIDER_SITE_OTHER): Payer: Federal, State, Local not specified - PPO | Admitting: Physician Assistant

## 2020-04-12 ENCOUNTER — Other Ambulatory Visit: Payer: Self-pay

## 2020-04-12 DIAGNOSIS — Z8616 Personal history of COVID-19: Secondary | ICD-10-CM

## 2020-04-12 DIAGNOSIS — J3089 Other allergic rhinitis: Secondary | ICD-10-CM | POA: Diagnosis not present

## 2020-04-12 MED ORDER — ZYRTEC ALLERGY 10 MG PO TABS: 10.0000 mg | ORAL_TABLET | Freq: Every day | ORAL | 11 refills | Status: DC

## 2020-04-12 NOTE — Progress Notes (Signed)
Established Patient Office Visit  Subjective:  Patient ID: Leonard Bryant, male    DOB: 1950/06/18  Age: 70 y.o. MRN: [id number]  CC:  Chief Complaint  Patient presents with  . Covid Positive    Follow up   Virtual Visit via Telephone Note  I connected with DASAN HARDMAN on1/13/22  at  2:30 PM EST by telephone and verified that I am speaking with the correct person using two identifiers.  Location: Patient: Home  Provider: [facility]   I discussed the limitations, risks, security and privacy concerns of performing an evaluation and management service by telephone and the availability of in person appointments. I also discussed with the patient that there may be a patient responsible charge related to this service. The patient expressed understanding and agreed to proceed.   History of Present Illness:  Leonard Bryant reports that he has been having postnasal drip, states that he feels he is having to cough to clear his throat many times throughout the day.  Reports that he recently tested positive for COVID with a home test on 03/28/2020.  Reports that he had mild symptoms, states that he did have a PCR test for COVID that showed that he was positive on April 09, 2020.  Reports that he is no longer having the mild symptoms other than the postnasal drip that was present for several months prior.  Reports he took a home COVID test this morning which was negative.  Reports that he had previously been using Zyrtec brand-name with relief, states that he switched to the generic approximately 6 months ago.  Believes the postnasal drip may have occurred at that time.  Reports that he is taking it on a daily basis.  Reports that he has tried adding Flonase without relief.     Observations/Objective: Medical history and current medications reviewed, no physical exam completed     Past Medical History:  Diagnosis Date  . Allergy    congestion  . 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 Hypertension   . Kidney stones     Past Surgical History:  Procedure Laterality Date  . COLONOSCOPY     15 years ago  . HERNIA REPAIR     Femoral?    Family History  Problem Relation Age of Onset  . Diabetes Mother   . Heart disease Mother   . Stroke Mother   . Colon cancer Neg Hx   . Esophageal cancer Neg Hx   . Rectal cancer Neg Hx   . Stomach cancer Neg Hx     Social History   Socioeconomic History  . Marital status: Married    Spouse name: Not on file  . Number of children: Not on file  . Years of education: Not on file  . Highest education level: Not on file  Occupational History  . Not on file  Tobacco Use  . Smoking status: Former Smoker    Packs/day: 1.50    Years: 5.00    Pack years: 7.50    Types: Cigarettes  . Smokeless tobacco: Never Used  Vaping Use  . Vaping Use: Never used  Substance and Sexual Activity  . Alcohol use: Yes    Comment: glass of wine or beer a day  . Drug use: No  . Sexual activity: Not on file  Other Topics Concern  . Not on file  Social History Narrative  . Not on file   Social  Determinants of Health   Financial Resource Strain: Not on file  Food Insecurity: Not on file  Transportation Needs: Not on file  Physical Activity: Not on file  Stress: Not on file  Social Connections: Not on file  Intimate Partner Violence: Not on file    Outpatient Medications Prior to Visit  Medication Sig Dispense Refill  . amLODipine (NORVASC) 5 MG tablet Take 1 tablet (5 mg total) by mouth daily. 90 tablet 3  . aspirin EC 81 MG tablet Take 81 mg by mouth daily.    Marland Kitchen FIBER PO Take by mouth daily.    . fluticasone (FLONASE) 50 MCG/ACT nasal spray SPRAY 2 SPRAYS INTO EACH NOSTRIL EVERY DAY 16 mL 1  . Multiple Vitamin (MULTIVITAMIN) tablet Take 1 tablet by mouth daily.     No facility-administered medications prior to visit.    Allergies  Allergen Reactions  . Pseudoephedrine Other (See Comments)    Testicular pain and  tenderness  . Penicillins Rash    Has patient had a PCN reaction causing immediate rash, facial/tongue/throat swelling, SOB or lightheadedness with hypotension: No Has patient had a PCN reaction causing severe rash involving mucus membranes or skin necrosis: No Has patient had a PCN reaction that required hospitalization No Has patient had a PCN reaction occurring within the last 10 years: No If all of the above answers are "NO", then may proceed with Cephalosporin use.     ROS Review of Systems  Constitutional: Negative for chills, fatigue and fever.  HENT: Positive for postnasal drip. Negative for congestion, rhinorrhea, sinus pressure, sneezing, sore throat and trouble swallowing.   Eyes: Negative.   Respiratory: Positive for cough. Negative for shortness of breath and wheezing.   Cardiovascular: Negative for chest pain.  Gastrointestinal: Negative.   Endocrine: Negative.   Genitourinary: Negative.   Musculoskeletal: Negative for myalgias.  Skin: Negative.   Allergic/Immunologic: Negative.   Neurological: Negative for headaches.  Hematological: Negative.   Psychiatric/Behavioral: Negative.       Objective:     There were no vitals taken for this visit. Wt Readings from Last 3 Encounters:  08/17/19 179 lb (81.2 kg)  08/03/19 179 lb 6.4 oz (81.4 kg)  07/06/19 181 lb (82.1 kg)     Health Maintenance Due  Topic Date Due  . INFLUENZA VACCINE  Never done  . PNA vac Low Risk Adult (2 of 2 - PPSV23) 12/30/2019  . COVID-19 Vaccine (3 - Booster for Moderna series) 03/27/2020    There are no preventive care reminders to display for this patient.  Lab Results  Component Value Date   TSH 1.490 12/30/2018   Lab Results  Component Value Date   WBC 3.0 (L) 12/30/2018   HGB 14.5 12/30/2018   HCT 44.3 12/30/2018   MCV 86 12/30/2018   PLT 205 12/30/2018   Lab Results  Component Value Date   NA 139 12/30/2018   K 4.9 12/30/2018   CO2 26 12/30/2018   GLUCOSE 96  12/30/2018   BUN 14 12/30/2018   CREATININE 1.04 12/30/2018   BILITOT 0.7 12/30/2018   ALKPHOS 62 12/30/2018   AST 21 12/30/2018   ALT 19 12/30/2018   PROT 6.8 12/30/2018   ALBUMIN 4.7 12/30/2018   CALCIUM 10.6 (H) 12/30/2018   ANIONGAP 6 01/20/2015   Lab Results  Component Value Date   CHOL 161 12/30/2018   Lab Results  Component Value Date   HDL 55 12/30/2018   Lab Results  Component Value Date  Hall 96 12/30/2018   Lab Results  Component Value Date   TRIG 45 12/30/2018   Lab Results  Component Value Date   CHOLHDL 2.9 12/30/2018   Lab Results  Component Value Date   HGBA1C 5.2 12/30/2018      Assessment & Plan:   Problem List Items Addressed This Visit      Respiratory   Allergic rhinitis - Primary   Relevant Medications   ZYRTEC ALLERGY 10 MG tablet     Assessment and Plan:   1. Seasonal allergic rhinitis due to other allergic trigger Trial of brand-name Zyrtec, encouraged patient to switch to Claritin if this does not offer relief after a few weeks.  Encouraged patient to follow-up if no relief.  Patient will call to schedule health maintenance physical - ZYRTEC ALLERGY 10 MG tablet; Take 1 tablet (10 mg total) by mouth daily.  Dispense: 30 tablet; Refill: 11   Follow Up Instructions:    I discussed the assessment and treatment plan with the patient. The patient was provided an opportunity to ask questions and all were answered. The patient agreed with the plan and demonstrated an understanding of the instructions.   The patient was advised to call back or seek an in-person evaluation if the symptoms worsen or if the condition fails to improve as anticipated.  I provided 21 minutes of non-face-to-face time during this encounter.    Meds ordered this encounter  Medications  . ZYRTEC ALLERGY 10 MG tablet    Sig: Take 1 tablet (10 mg total) by mouth daily.    Dispense:  30 tablet    Refill:  11    Generic ineffective    Order Specific  Question:   Supervising Provider    Answer:   Elsie Stain [[id number]]    Follow-up: Return if symptoms worsen or fail to improve.    Loraine Grip Keric Zehren, PA-C

## 2020-04-12 NOTE — Progress Notes (Signed)
Patient verified DOB Patient complains of congestion beginning 03/29/20. Patient tested positive on 03/28/20 from home test. Patient shares congestion is still present. Patient had a drive through testing which showed positive on 04/09/20. Patient did a home test this morning which showed negative. Patient denies N/V/ diarrhea. Patients wife tested negative on 03/28/20.

## 2020-04-13 ENCOUNTER — Encounter: Payer: Self-pay | Admitting: Physician Assistant

## 2020-04-13 DIAGNOSIS — Z8616 Personal history of COVID-19: Secondary | ICD-10-CM | POA: Insufficient documentation

## 2020-04-13 NOTE — Patient Instructions (Signed)
I sent a prescription for brand-name Zyrtec to your pharmacy, I encourage you to do a trial of that to see if it does help offer relief from your postnasal drip.  If unsuccessful, I encourage you to do a trial of over-the-counter Claritin.  If unsuccessful, I encourage you to schedule a follow-up for further review.  I encourage you to schedule a follow-up for your yearly health maintenance.  Please let us know if there is anything else we can do for you  Kennieth Rad, PA-C Physician Assistant [place] [email or web address]    Valere Dross &amp; Nadel's Textbook of Respiratory Medicine (7th ed., pp. [phone]). Elsevier.">  Postnasal Drip Postnasal drip is the feeling of mucus going down the back of your throat. Mucus is a slimy substance that moistens and cleans your nose and throat, as well as the air pockets in face bones near your forehead and cheeks (sinuses). Small amounts of mucus pass from your nose and sinuses down the back of your throat all the time. This is normal. When you produce too much mucus or the mucus gets too thick, you can feel it. Some common causes of postnasal drip include:  Having more mucus because of: ? A cold or the flu. ? Allergies. ? Cold air. ? Certain medicines.  Having more mucus that is thicker because of: ? A sinus or nasal infection. ? Dry air. ? A food allergy. Follow these instructions at home: Relieving discomfort  Gargle with a salt-water mixture 3-4 times a day or as needed. To make a salt-water mixture, completely dissolve -1 tsp of salt in 1 cup of warm water.  If the air in your home is dry, use a humidifier to add moisture to the air.  Use a saline spray or container (neti pot) to flush out the nose (nasal irrigation). These methods can help clear away mucus and keep the nasal passages moist.   General instructions  Take over-the-counter and prescription medicines only as told by  your health care provider.  Follow instructions from your health care provider about eating or drinking restrictions. You may need to avoid caffeine.  Avoid things that you know you are allergic to (allergens), like dust, mold, pollen, pets, or certain foods.  Drink enough fluid to keep your urine pale yellow.  Keep all follow-up visits as told by your health care provider. This is important. Contact a health care provider if:  You have a fever.  You have a sore throat.  You have difficulty swallowing.  You have headache.  You have sinus pain.  You have a cough that does not go away.  The mucus from your nose becomes thick and is green or yellow in color.  You have cold or flu symptoms that last more than 10 days. Summary  Postnasal drip is the feeling of mucus going down the back of your throat.  If your health care provider approves, use nasal irrigation or a nasal spray 2?4 times a day.  Avoid things that you know you are allergic to (allergens), like dust, mold, pollen, pets, or certain foods. This information is not intended to replace advice given to you by your health care provider. Make sure you discuss any questions you have with your health care provider. Document Revised: 12/27/2019 Document Reviewed: 12/27/2019 Elsevier Patient Education  2021 [employer].

## 2020-06-08 ENCOUNTER — Other Ambulatory Visit: Payer: Self-pay | Admitting: Internal Medicine

## 2020-06-08 DIAGNOSIS — I10 Essential (primary) hypertension: Secondary | ICD-10-CM

## 2020-06-27 ENCOUNTER — Other Ambulatory Visit: Payer: Self-pay

## 2020-06-27 ENCOUNTER — Ambulatory Visit: Payer: Federal, State, Local not specified - PPO | Admitting: Physician Assistant

## 2020-06-27 ENCOUNTER — Ambulatory Visit: Payer: Federal, State, Local not specified - PPO | Admitting: Internal Medicine

## 2020-06-27 VITALS — BP 139/80 | HR 69 | Temp 98.7°F | Resp 18 | Ht 66.5 in | Wt 183.0 lb

## 2020-06-27 DIAGNOSIS — R972 Elevated prostate specific antigen [PSA]: Secondary | ICD-10-CM

## 2020-06-27 DIAGNOSIS — H43391 Other vitreous opacities, right eye: Secondary | ICD-10-CM | POA: Diagnosis not present

## 2020-06-27 DIAGNOSIS — I10 Essential (primary) hypertension: Secondary | ICD-10-CM

## 2020-06-27 DIAGNOSIS — E785 Hyperlipidemia, unspecified: Secondary | ICD-10-CM

## 2020-06-27 DIAGNOSIS — Z125 Encounter for screening for malignant neoplasm of prostate: Secondary | ICD-10-CM

## 2020-06-27 DIAGNOSIS — I493 Ventricular premature depolarization: Secondary | ICD-10-CM

## 2020-06-27 DIAGNOSIS — D72819 Decreased white blood cell count, unspecified: Secondary | ICD-10-CM

## 2020-06-27 LAB — EKG 12-LEAD

## 2020-06-27 NOTE — Patient Instructions (Addendum)
I encourage you to work on monitoring your sodium intake, you can use the app "my fitness pal".  Increase cardiovascular exercise to 20 to 30 minutes 5 times a week, continue your great water intake in great sleep.  We will call you with your lab results.  Kennieth Rad, PA-C Physician Assistant [facility] [email or web address]       Low-Sodium Eating Plan Sodium, which is an element that makes up salt, helps you maintain a healthy balance of fluids in your body. Too much sodium can increase your blood pressure and cause fluid and waste to be held in your body. Your health care provider or dietitian may recommend following this plan if you have high blood pressure (hypertension), kidney disease, liver disease, or heart failure. Eating less sodium can help lower your blood pressure, reduce swelling, and protect your heart, liver, and kidneys. What are tips for following this plan? Reading food labels  The Nutrition Facts label lists the amount of sodium in one serving of the food. If you eat more than one serving, you must multiply the listed amount of sodium by the number of servings.  Choose foods with less than 140 mg of sodium per serving.  Avoid foods with 300 mg of sodium or more per serving. Shopping  Look for lower-sodium products, often labeled as "low-sodium" or "no salt added."  Always check the sodium content, even if foods are labeled as "unsalted" or "no salt added."  Buy fresh foods. ? Avoid canned foods and pre-made or frozen meals. ? Avoid canned, cured, or processed meats.  Buy breads that have less than 80 mg of sodium per slice.   Cooking  Eat more home-cooked food and less restaurant, buffet, and fast food.  Avoid adding salt when cooking. Use salt-free seasonings or herbs instead of table salt or sea salt. Check with your health care provider or pharmacist before using salt substitutes.  Cook with  plant-based oils, such as canola, sunflower, or olive oil.   Meal planning  When eating at a restaurant, ask that your food be prepared with less salt or no salt, if possible. Avoid dishes labeled as brined, pickled, cured, smoked, or made with soy sauce, miso, or teriyaki sauce.  Avoid foods that contain MSG (monosodium glutamate). MSG is sometimes added to [place] food, bouillon, and some canned foods.  Make meals that can be grilled, baked, poached, roasted, or steamed. These are generally made with less sodium. General information Most people on this plan should limit their sodium intake to 1,500-2,000 mg (milligrams) of sodium each day. What foods should I eat? Fruits Fresh, frozen, or canned fruit. Fruit juice. Vegetables Fresh or frozen vegetables. "No salt added" canned vegetables. "No salt added" tomato sauce and paste. Low-sodium or reduced-sodium tomato and vegetable juice. Grains Low-sodium cereals, including oats, puffed wheat and rice, and shredded wheat. Low-sodium crackers. Unsalted rice. Unsalted pasta. Low-sodium bread. Whole-grain breads and whole-grain pasta. Meats and other proteins Fresh or frozen (no salt added) meat, poultry, seafood, and fish. Low-sodium canned tuna and salmon. Unsalted nuts. Dried peas, beans, and lentils without added salt. Unsalted canned beans. Eggs. Unsalted nut butters. Dairy Milk. Soy milk. Cheese that is naturally low in sodium, such as ricotta cheese, fresh mozzarella, or Swiss cheese. Low-sodium or reduced-sodium cheese. Cream cheese. Yogurt. Seasonings and condiments Fresh and dried herbs and spices. Salt-free seasonings. Low-sodium mustard and ketchup. Sodium-free salad dressing. Sodium-free light mayonnaise. Fresh or refrigerated horseradish. Lemon juice. Vinegar. Other foods Homemade,  reduced-sodium, or low-sodium soups. Unsalted popcorn and pretzels. Low-salt or salt-free chips. The items listed above may not be a complete list of  foods and beverages you can eat. Contact a dietitian for more information. What foods should I avoid? Vegetables Sauerkraut, pickled vegetables, and relishes. Olives. [place] fries. Onion rings. Regular canned vegetables (not low-sodium or reduced-sodium). Regular canned tomato sauce and paste (not low-sodium or reduced-sodium). Regular tomato and vegetable juice (not low-sodium or reduced-sodium). Frozen vegetables in sauces. Grains Instant hot cereals. Bread stuffing, pancake, and biscuit mixes. Croutons. Seasoned rice or pasta mixes. Noodle soup cups. Boxed or frozen macaroni and cheese. Regular salted crackers. Self-rising flour. Meats and other proteins Meat or fish that is salted, canned, smoked, spiced, or pickled. Precooked or cured meat, such as sausages or meat loaves. Berniece Salines. Ham. Pepperoni. Hot dogs. Corned beef. Chipped beef. Salt pork. Jerky. Pickled herring. Anchovies and sardines. Regular canned tuna. Salted nuts. Dairy Processed cheese and cheese spreads. Hard cheeses. Cheese curds. Blue cheese. Feta cheese. String cheese. Regular cottage cheese. Buttermilk. Canned milk. Fats and oils Salted butter. Regular margarine. Ghee. Bacon fat. Seasonings and condiments Onion salt, garlic salt, seasoned salt, table salt, and sea salt. Canned and packaged gravies. Worcestershire sauce. Tartar sauce. Barbecue sauce. Teriyaki sauce. Soy sauce, including reduced-sodium. Steak sauce. Fish sauce. Oyster sauce. Cocktail sauce. Horseradish that you find on the shelf. Regular ketchup and mustard. Meat flavorings and tenderizers. Bouillon cubes. Hot sauce. Pre-made or packaged marinades. Pre-made or packaged taco seasonings. Relishes. Regular salad dressings. Salsa. Other foods Salted popcorn and pretzels. Corn chips and puffs. Potato and tortilla chips. Canned or dried soups. Pizza. Frozen entrees and pot pies. The items listed above may not be a complete list of foods and beverages you should avoid.  Contact a dietitian for more information. Summary  Eating less sodium can help lower your blood pressure, reduce swelling, and protect your heart, liver, and kidneys.  Most people on this plan should limit their sodium intake to 1,500-2,000 mg (milligrams) of sodium each day.  Canned, boxed, and frozen foods are high in sodium. Restaurant foods, fast foods, and pizza are also very high in sodium. You also get sodium by adding salt to food.  Try to cook at home, eat more fresh fruits and vegetables, and eat less fast food and canned, processed, or prepared foods. This information is not intended to replace advice given to you by your health care provider. Make sure you discuss any questions you have with your health care provider. Document Revised: 04/22/2019 Document Reviewed: 02/16/2019 Elsevier Patient Education  2021 [place].    Premature Ventricular Contraction  A premature ventricular contraction (PVC) is a common kind of irregular heartbeat (arrhythmia). These contractions are extra heartbeats that start in the ventricles of the heart and occur too early in the normal sequence. During the PVC, the heart's normal electrical pathway is not used, so the beat is shorter and less effective. In most cases, these contractions come and go and do not require treatment. What are the causes? Common causes of the condition include:  Smoking.  Drinking alcohol.  Certain medicines.  Some illegal drugs.  Stress.  Caffeine. Certain medical conditions can also cause PVCs:  Heart failure.  Heart attack, or coronary artery disease.  Heart valve problems.  Changes in minerals in the blood (electrolytes).  Low blood oxygen levels or high carbon dioxide levels. In many cases, the cause of this condition is not known. What are the signs or symptoms?  The main symptom of this condition is fast or skipped heartbeats (palpitations). Other symptoms include:  Chest pain.  Shortness  of breath.  Feeling tired.  Dizziness.  Difficulty exercising. In some cases, there are no symptoms. How is this diagnosed? This condition may be diagnosed based on:  Your medical history.  A physical exam. During the exam, the health care provider will check for irregular heartbeats.  Tests, such as: ? An ECG (electrocardiogram) to monitor the electrical activity of your heart. ? An ambulatory cardiac monitor. This device records your heartbeats for 24 hours or more. ? Stress tests to see how exercise affects your heart rhythm and blood supply. ? An echocardiogram. This test uses sound waves (ultrasound) to produce an image of your heart. ? An electrophysiology study (EPS). This test checks for electrical problems in your heart. How is this treated? Treatment for this condition depends on any underlying conditions, the type of PVCs that you are having, and how much the symptoms are interfering with your daily life. Possible treatments include:  Avoiding things that cause premature contractions (triggers). These include caffeine and alcohol.  Taking medicines if symptoms are severe or if the extra heartbeats are frequent.  Getting treatment for underlying conditions that cause PVCs.  Having an implantable cardioverter defibrillator (ICD), if you are at risk for a serious arrhythmia. The ICD is a small device that is inserted into your chest to monitor your heartbeat. When it senses an irregular heartbeat, it sends a shock to bring the heartbeat back to normal.  Having a procedure to destroy the portion of the heart tissue that sends out abnormal signals (catheter ablation). In some cases, no treatment is required. Follow these instructions at home: Lifestyle  Do not use any products that contain nicotine or tobacco, such as cigarettes, e-cigarettes, and chewing tobacco. If you need help quitting, ask your health care provider.  Do not use illegal drugs.  Exercise regularly.  Ask your health care provider what type of exercise is safe for you.  Try to get at least 7-9 hours of sleep each night, or as much as recommended by your health care provider.  Find healthy ways to manage stress. Avoid stressful situations when possible. Alcohol use  Do not drink alcohol if: ? Your health care provider tells you not to drink. ? You are pregnant, may be pregnant, or are planning to become pregnant. ? Alcohol triggers your episodes.  If you drink alcohol: ? Limit how much you use to:  0-1 drink a day for women.  0-2 drinks a day for men.  Be aware of how much alcohol is in your drink. In the [place] one drink equals one 12 oz bottle of beer (355 mL), one 5 oz glass of wine (148 mL), or one 1 oz glass of hard liquor (44 mL). General instructions  Take over-the-counter and prescription medicines only as told by your health care provider.  If caffeine triggers episodes of PVC, do not eat, drink, or use anything with caffeine in it.  Keep all follow-up visits as told by your health care provider. This is important. Contact a health care provider if you:  Feel palpitations. Get help right away if you:  Have chest pain.  Have shortness of breath.  Have sweating for no reason.  Have nausea and vomiting.  Become light-headed or you faint. Summary  A premature ventricular contraction (PVC) is a common kind of irregular heartbeat (arrhythmia).  In most cases, these contractions come and  go and do not require treatment.  You may need to wear an ambulatory cardiac monitor. This records your heartbeats for 24 hours or more.  Treatment depends on any underlying conditions, the type of PVCs that you are having, and how much the symptoms are interfering with your daily life. This information is not intended to replace advice given to you by your health care provider. Make sure you discuss any questions you have with your health care provider. Document Revised:  12/10/2017 Document Reviewed: 12/10/2017 Elsevier Patient Education  2021 [employer].

## 2020-06-27 NOTE — Progress Notes (Signed)
Established Patient Office Visit  Subjective:  Patient ID: Leonard Bryant, male    DOB: 1951-02-08  Age: 70 y.o. MRN: [id number]  CC: No chief complaint on file.   HPI Leonard Bryant presents for medication refills and would like to address several complaints.  Reports that he does check his blood pressure at home, states readings within normal limits.  Reports that he has been experiencing episodes of chest pain.  Reports that he notices them in the evening when he is relaxing states that they last just a second but feels as if it is a quick shooting pain that he describes as achy in the middle of his chest.  Reports that they started around the beginning of the year.  Has not tried anything for relief.  Denies heartburn or acid reflux.  Denies shortness of breath  Reports low stress level, sleep is good, is drinking approximately 6 bottles of water a day.  Reports that he was previously prescribed eyedrops for his left eye.  Reports that he went to optometry to discuss floaters in his right eye but was told that he had pressure in his left eye and was drops to use.  Reports that he used them for approximately 1 month but did not continue, states he did not notice any difference.  Reports he continues to see floaters in his right eye, denies any eye pain  States that he would like to know what lifestyle modifications he can make in order to be able to stop taking blood pressure medication.   Past Medical History:  Diagnosis Date  . Allergy    congestion  . 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 Hypertension   . Kidney stones     Past Surgical History:  Procedure Laterality Date  . COLONOSCOPY     15 years ago  . HERNIA REPAIR     Femoral?    Family History  Problem Relation Age of Onset  . Diabetes Mother   . Heart disease Mother   . Stroke Mother   . Colon cancer Neg Hx   . Esophageal cancer Neg Hx   . Rectal cancer Neg Hx   . Stomach cancer Neg Hx     Social  History   Socioeconomic History  . Marital status: Married    Spouse name: Not on file  . Number of children: Not on file  . Years of education: Not on file  . Highest education level: Not on file  Occupational History  . Not on file  Tobacco Use  . Smoking status: Former Smoker    Packs/day: 1.50    Years: 5.00    Pack years: 7.50    Types: Cigarettes  . Smokeless tobacco: Never Used  Vaping Use  . Vaping Use: Never used  Substance and Sexual Activity  . Alcohol use: Yes    Comment: glass of wine or beer a day  . Drug use: No  . Sexual activity: Not on file  Other Topics Concern  . Not on file  Social History Narrative  . Not on file   Social Determinants of Health   Financial Resource Strain: Not on file  Food Insecurity: Not on file  Transportation Needs: Not on file  Physical Activity: Not on file  Stress: Not on file  Social Connections: Not on file  Intimate Partner Violence: Not on file    Outpatient Medications Prior to Visit  Medication Sig Dispense Refill  .  amLODipine (NORVASC) 5 MG tablet Take 1 tablet (5 mg total) by mouth daily. 90 tablet 3  . aspirin EC 81 MG tablet Take 81 mg by mouth daily.    Marland Kitchen FIBER PO Take by mouth daily.    . fluticasone (FLONASE) 50 MCG/ACT nasal spray SPRAY 2 SPRAYS INTO EACH NOSTRIL EVERY DAY 16 mL 1  . latanoprost (XALATAN) 0.005 % ophthalmic solution INSTILL 1 DROP IN [facility] EYE AT BEDTIME    . Multiple Vitamin (MULTIVITAMIN) tablet Take 1 tablet by mouth daily.    Marland Kitchen ZYRTEC ALLERGY 10 MG tablet Take 1 tablet (10 mg total) by mouth daily. 30 tablet 11   No facility-administered medications prior to visit.    Allergies  Allergen Reactions  . Pseudoephedrine Other (See Comments)    Testicular pain and tenderness  . Penicillins Rash    Has patient had a PCN reaction causing immediate rash, facial/tongue/throat swelling, SOB or lightheadedness with hypotension: No Has patient had a PCN reaction causing severe rash  involving mucus membranes or skin necrosis: No Has patient had a PCN reaction that required hospitalization No Has patient had a PCN reaction occurring within the last 10 years: No If all of the above answers are "NO", then may proceed with Cephalosporin use.     ROS Review of Systems  Constitutional: Negative.   HENT: Negative.   Eyes: Positive for visual disturbance. Negative for pain.  Respiratory: Negative for shortness of breath.   Cardiovascular: Positive for chest pain.  Gastrointestinal: Negative.   Endocrine: Negative.   Genitourinary: Negative for difficulty urinating, hematuria and urgency.  Musculoskeletal: Negative.   Skin: Negative.   Allergic/Immunologic: Negative.   Neurological: Negative.   Hematological: Negative.   Psychiatric/Behavioral: Negative.       Objective:    Physical Exam Vitals and nursing note reviewed.  Constitutional:      General: He is not in acute distress.    Appearance: Normal appearance. He is not ill-appearing.  HENT:     Head: Normocephalic and atraumatic.     Right Ear: External ear normal.     Left Ear: External ear normal.     Nose: Nose normal.     Mouth/Throat:     Mouth: Mucous membranes are moist.     Pharynx: Oropharynx is clear.  Eyes:     Extraocular Movements: Extraocular movements intact.     Conjunctiva/sclera: Conjunctivae normal.     Pupils: Pupils are equal, round, and reactive to light.  Cardiovascular:     Rate and Rhythm: Normal rate and regular rhythm.     Pulses: Normal pulses.     Heart sounds: Normal heart sounds.  Pulmonary:     Effort: Pulmonary effort is normal.     Breath sounds: Normal breath sounds.  Musculoskeletal:        General: Normal range of motion.     Cervical back: Normal range of motion and neck supple.  Skin:    General: Skin is warm and dry.  Neurological:     General: No focal deficit present.     Mental Status: He is alert and oriented to person, place, and time.   Psychiatric:        Mood and Affect: Mood normal.        Behavior: Behavior normal.        Thought Content: Thought content normal.        Judgment: Judgment normal.     BP 139/80 (BP Location: Left Arm, Patient Position:  Sitting, Cuff Size: Normal)   Pulse 69   Temp 98.7 F (37.1 C) (Oral)   Resp 18   Ht 5' 6.5" (1.689 m)   Wt 183 lb (83 kg)   SpO2 100%   BMI 29.09 kg/m  Wt Readings from Last 3 Encounters:  06/27/20 183 lb (83 kg)  08/17/19 179 lb (81.2 kg)  08/03/19 179 lb 6.4 oz (81.4 kg)     Health Maintenance Due  Topic Date Due  . INFLUENZA VACCINE  Never done    There are no preventive care reminders to display for this patient.  Lab Results  Component Value Date   TSH 1.220 06/27/2020   Lab Results  Component Value Date   WBC 3.1 (L) 06/27/2020   HGB 13.3 06/27/2020   HCT 39.5 06/27/2020   MCV 82 06/27/2020   PLT 240 06/27/2020   Lab Results  Component Value Date   NA 140 06/27/2020   K 4.1 06/27/2020   CO2 26 12/30/2018   GLUCOSE 90 06/27/2020   BUN 15 06/27/2020   CREATININE 0.81 06/27/2020   BILITOT 0.4 06/27/2020   ALKPHOS 57 06/27/2020   AST 35 06/27/2020   ALT 19 12/30/2018   PROT 6.7 06/27/2020   ALBUMIN 4.6 06/27/2020   CALCIUM 9.8 06/27/2020   ANIONGAP 6 01/20/2015   Lab Results  Component Value Date   CHOL 155 06/27/2020   Lab Results  Component Value Date   HDL 49 06/27/2020   Lab Results  Component Value Date   LDLCALC 96 06/27/2020   Lab Results  Component Value Date   TRIG 45 06/27/2020   Lab Results  Component Value Date   CHOLHDL 3.2 06/27/2020   Lab Results  Component Value Date   HGBA1C 5.2 12/30/2018      Assessment & Plan:   Problem List Items Addressed This Visit      Cardiovascular and Mediastinum   Essential hypertension - Primary   Relevant Orders   CBC with Differential/Platelet (Completed)   Comp. Metabolic Panel (12) (Completed)   TSH (Completed)     Other   Hyperlipidemia    Relevant Orders   Lipid panel (Completed)    Other Visit Diagnoses    Floaters in visual field, right       Relevant Orders   Ambulatory referral to Optometry   PVC (premature ventricular contraction)       Relevant Orders   EKG 12-Lead (Completed)   Ambulatory referral to Cardiology   Screening PSA (prostate specific antigen)       Relevant Orders   PSA (Completed)     1. Essential hypertension Patient education given on low-sodium diet.  Patient encouraged to continue checking blood pressure on a daily basis, keep a written log and have available for all office visits. - CBC with Differential/Platelet - Comp. Metabolic Panel (12) - TSH  2. Hyperlipidemia, unspecified hyperlipidemia type  - Lipid panel  3. Floaters in visual field, right  - Ambulatory referral to Optometry  4. PVC (premature ventricular contraction) EKG showed PVCs.  Patient education given, referred to cardiology for further review - EKG 12-Lead - Ambulatory referral to Cardiology  5. Screening PSA (prostate specific antigen)  - PSA  No orders of the defined types were placed in this encounter.  Patient given appointment to establish care with Dr. Margarita Rana at [facility].  I have reviewed the patient's medical history (PMH, PSH, Social History, Family History, Medications, and allergies) , and have  been updated if relevant. I spent 30 minutes reviewing chart and  face to face time with patient.    Follow-up: Return in about 3 months (around 09/27/2020) for At [facility].    Loraine Grip Mayers, PA-C

## 2020-06-28 DIAGNOSIS — I493 Ventricular premature depolarization: Secondary | ICD-10-CM | POA: Insufficient documentation

## 2020-06-28 DIAGNOSIS — H43391 Other vitreous opacities, right eye: Secondary | ICD-10-CM | POA: Insufficient documentation

## 2020-06-28 LAB — CBC WITH DIFFERENTIAL/PLATELET
Basophils Absolute: 0 10*3/uL (ref 0.0–0.2)
Basos: 1 %
EOS (ABSOLUTE): 0.1 10*3/uL (ref 0.0–0.4)
Eos: 4 %
Hematocrit: 39.5 % (ref 37.5–51.0)
Hemoglobin: 13.3 g/dL (ref 13.0–17.7)
Immature Grans (Abs): 0 10*3/uL (ref 0.0–0.1)
Immature Granulocytes: 0 %
Lymphocytes Absolute: 1 10*3/uL (ref 0.7–3.1)
Lymphs: 33 %
MCH: 27.7 pg (ref 26.6–33.0)
MCHC: 33.7 g/dL (ref 31.5–35.7)
MCV: 82 fL (ref 79–97)
Monocytes Absolute: 0.3 10*3/uL (ref 0.1–0.9)
Monocytes: 10 %
Neutrophils Absolute: 1.6 10*3/uL (ref 1.4–7.0)
Neutrophils: 52 %
Platelets: 240 10*3/uL (ref 150–450)
RBC: 4.8 x10E6/uL (ref 4.14–5.80)
RDW: 13.7 % (ref 11.6–15.4)
WBC: 3.1 10*3/uL — ABNORMAL LOW (ref 3.4–10.8)

## 2020-06-28 LAB — COMP. METABOLIC PANEL (12)
AST: 35 IU/L (ref 0–40)
Albumin/Globulin Ratio: 2.2 (ref 1.2–2.2)
Albumin: 4.6 g/dL (ref 3.8–4.8)
Alkaline Phosphatase: 57 IU/L (ref 44–121)
BUN/Creatinine Ratio: 19 (ref 10–24)
BUN: 15 mg/dL (ref 8–27)
Bilirubin Total: 0.4 mg/dL (ref 0.0–1.2)
Calcium: 9.8 mg/dL (ref 8.6–10.2)
Chloride: 105 mmol/L (ref 96–106)
Creatinine, Ser: 0.81 mg/dL (ref 0.76–1.27)
Globulin, Total: 2.1 g/dL (ref 1.5–4.5)
Glucose: 90 mg/dL (ref 65–99)
Potassium: 4.1 mmol/L (ref 3.5–5.2)
Sodium: 140 mmol/L (ref 134–144)
Total Protein: 6.7 g/dL (ref 6.0–8.5)
eGFR: 96 mL/min/{1.73_m2} (ref >59)

## 2020-06-28 LAB — LIPID PANEL
Chol/HDL Ratio: 3.2 ratio (ref 0.0–5.0)
Cholesterol, Total: 155 mg/dL (ref 100–199)
HDL: 49 mg/dL (ref >39)
LDL Chol Calc (NIH): 96 mg/dL (ref 0–99)
Triglycerides: 45 mg/dL (ref 0–149)
VLDL Cholesterol Cal: 10 mg/dL (ref 5–40)

## 2020-06-28 LAB — PSA: Prostate Specific Ag, Serum: 4.1 ng/mL — ABNORMAL HIGH (ref 0.0–4.0)

## 2020-06-28 LAB — TSH: TSH: 1.22 u[IU]/mL (ref 0.450–4.500)

## 2020-06-28 MED ORDER — ATORVASTATIN CALCIUM 10 MG PO TABS: 5.0000 mg | ORAL_TABLET | Freq: Every day | ORAL | 3 refills | Status: DC

## 2020-06-28 NOTE — Addendum Note (Signed)
Addended by: Kennieth Rad on: 06/28/2020 01:25 PM   Modules accepted: Orders

## 2020-07-05 ENCOUNTER — Other Ambulatory Visit: Payer: Self-pay | Admitting: Internal Medicine

## 2020-07-05 DIAGNOSIS — I10 Essential (primary) hypertension: Secondary | ICD-10-CM

## 2020-07-09 ENCOUNTER — Telehealth: Payer: Self-pay | Admitting: *Deleted

## 2020-07-09 NOTE — Telephone Encounter (Signed)
Medical Assistant left message on patient's home and cell voicemail. Voicemail states to give a call back to [place] with [facility] at [phone]. Patient is aware of per signed DPR of kidney, liver, and thyroid being normal. Patient is aware of cholesterol being normal though his risk factor for cardiovascular concern benig at a 19. Patient advised to take low dose medication and adhere to low cholesterol diet. Patient is aware of urology referral being placed to follow up on slightly elevated PSA and slightly below range WBC.

## 2020-07-09 NOTE — Telephone Encounter (Signed)
-----   Message from Kennieth Rad, [place] sent at 06/28/2020  1:25 PM EDT ----- Please call patient and let him know that his thyroid, kidney and liver function are within normal limits.  His cholesterol overall is within normal limits, however due to other risk factors his risk of a heart attack or stroke in the next 10 years is 19.3% and it is recommended that he start a low-dose of cholesterol medication as well as follow a low-cholesterol diet.  I have sent a medication to his pharmacy  His PSA level was very slightly elevated, this was also elevated 3 years ago, however back within normal limits 2 years ago.  I did see that he was previously referred to urology but was unable to determine if he did follow-up with urology for further evaluation.  I am going to start a another referral for him to be seen by urology for further evaluation. He did have a white blood cell count that was slightly below normal limits, this has previously been seen in his labs but last year it was within normal limits.  This may be in correlation to the PSA so it does warrant further evaluation by urology   The 10-year ASCVD risk score Mikey Bussing DC Brooke Bonito., et al., 2013) is: 19.3%   Values used to calculate the score:     Age: 87 years     Sex: Male     Is Non-Hispanic African American: Yes     Diabetic: No     Tobacco smoker: No     Systolic Blood Pressure: 670 mmHg     Is BP treated: Yes     HDL Cholesterol: 49 mg/dL     Total Cholesterol: 155 mg/dL

## 2020-07-12 ENCOUNTER — Ambulatory Visit: Payer: Federal, State, Local not specified - PPO | Admitting: Urology

## 2020-08-11 ENCOUNTER — Encounter: Payer: Self-pay | Admitting: Cardiology

## 2020-08-11 NOTE — Progress Notes (Signed)
Cardiology Office Note   Date:  08/13/2020   ID:  Leonard Bryant, DOB [birth year], MRN [id number]  PCP:  Pcp, No  Cardiologist:   None Referring:  Kennieth Rad, PA-C   Chief Complaint  Patient presents with  . Palpitations      History of Present Illness: Leonard Bryant is a 70 y.o. male who is referred by Kennieth Rad, PA-C for evaluation of PVCs.  He has no past cardiac history.  He was trying to get his medications renewed recently as he is in between primary care doctors and he was noted to have ectopy on EKG.  I reviewed this from March and he had some premature atrial contractions.  He does feel some palpitations.  They have been it seems in the evening when he is resting.  He does not feel them when he is working.  He has a hard time describing them.  He feels.  It may be a faint aching.  He cannot bring them on with activities.  He denies any sustained tachyarrhythmias.  He has not had any presyncope or syncope.  He has had no new shortness of breath, PND or orthopnea.  He has had no weight gain or edema.  He is really not had any prior cardiac work-up.  He is physically active although he is not exercising routinely.  He does a lot of yard work.  He was working a physical job with the [employer].  Past Medical History:  Diagnosis Date  . 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 Hypertension   . Kidney stones     Past Surgical History:  Procedure Laterality Date  . COLONOSCOPY     15 years ago  . HERNIA REPAIR     Inguinal      Current Outpatient Medications  Medication Sig Dispense Refill  . amLODipine (NORVASC) 5 MG tablet TAKE 1 TABLET BY MOUTH EVERY DAY 90 tablet 1  . aspirin EC 81 MG tablet Take 81 mg by mouth daily.    Marland Kitchen atorvastatin (LIPITOR) 10 MG tablet Take 0.5 tablets (5 mg total) by mouth daily. 90 tablet 3  . FIBER PO Take by mouth daily.    . fluticasone (FLONASE) 50 MCG/ACT nasal spray SPRAY 2 SPRAYS INTO EACH NOSTRIL EVERY DAY 16 mL 1  .  latanoprost (XALATAN) 0.005 % ophthalmic solution INSTILL 1 DROP IN [facility] EYE AT BEDTIME    . Multiple Vitamin (MULTIVITAMIN) tablet Take 1 tablet by mouth daily.    Marland Kitchen ZYRTEC ALLERGY 10 MG tablet Take 1 tablet (10 mg total) by mouth daily. 30 tablet 11   No current facility-administered medications for this visit.    Allergies:   Pseudoephedrine and Penicillins    Social History:  The patient  reports that he has quit smoking. His smoking use included cigarettes. He has a 7.50 pack-year smoking history. He has never used smokeless tobacco. He reports current alcohol use. He reports that he does not use drugs.   Family History:  The patient's family history includes Clotting disorder in his sister; Diabetes in his mother; Heart disease in his mother; Stroke in his mother.   Did not know father's history.    ROS:  Please see the history of present illness.   Otherwise, review of systems are positive for none.   All other systems are reviewed and negative.    PHYSICAL EXAM: VS:  BP (!) 144/88   Pulse 68  Ht 5\' 8"  (1.727 m)   Wt 165 lb (74.8 kg)   SpO2 97%   BMI 25.09 kg/m  , BMI Body mass index is 25.09 kg/m. GENERAL:  Well appearing HEENT:  Pupils equal round and reactive, fundi not visualized, oral mucosa unremarkable NECK:  No jugular venous distention, waveform within normal limits, carotid upstroke brisk and symmetric, no bruits, no thyromegaly LYMPHATICS:  No cervical, inguinal adenopathy LUNGS:  Clear to auscultation bilaterally BACK:  No CVA tenderness CHEST:  Unremarkable HEART:  PMI not displaced or sustained,S1 and S2 within normal limits, no S3, no S4, no clicks, no rubs, no murmurs ABD:  Flat, positive bowel sounds normal in frequency in pitch, no bruits, no rebound, no guarding, no midline pulsatile mass, no hepatomegaly, no splenomegaly EXT:  2 plus pulses throughout, no edema, no cyanosis no clubbing SKIN:  No rashes no nodules NEURO:  Cranial nerves II through XII  grossly intact, motor grossly intact throughout PSYCH:  Cognitively intact, oriented to person place and time    EKG:  EKG is ordered today. The ekg ordered today demonstrates sinus rhythm, rate 68, axis within normal limits, intervals within normal limits, no acute ST-T wave changes.   Recent Labs: 06/27/2020: BUN 15; Creatinine, Ser 0.81; Hemoglobin 13.3; Platelets 240; Potassium 4.1; Sodium 140; TSH 1.220    Lipid Panel    Component Value Date/Time   CHOL 155 06/27/2020 1504   TRIG 45 06/27/2020 1504   HDL 49 06/27/2020 1504   CHOLHDL 3.2 06/27/2020 1504   CHOLHDL 3.3 03/15/2014 1113   VLDL 15 03/15/2014 1113   LDLCALC 96 06/27/2020 1504   LDLDIRECT 106 (H) 05/05/2012 1032      Wt Readings from Last 3 Encounters:  08/13/20 165 lb (74.8 kg)  06/27/20 183 lb (83 kg)  08/17/19 179 lb (81.2 kg)      Other studies Reviewed: Additional studies/ records that were reviewed today include: Primary care office records and EKG.. Review of the above records demonstrates:  Please see elsewhere in the note.     ASSESSMENT AND PLAN:  PACs: I am seeing evidence of premature atrial contractions.  He is having them frequently during this exam.  I will have him wear a 3-day Zio patch.  I do see that electrolytes were unremarkable.  Thyroid was unremarkable.  HYPERTENSION: I am going to have him keep a blood pressure diary.  RISK REDUCTION: I am going to have him check a coronary calcium score.  This will help determine goals of therapy with his lipids.   Current medicines are reviewed at length with the patient today.  The patient does not have concerns regarding medicines.  The following changes have been made:  no change  Labs/ tests ordered today include:   Orders Placed This Encounter  Procedures  . CT CARDIAC SCORING (SELF PAY ONLY)  . LONG TERM MONITOR (3-14 DAYS)  . EKG 12-Lead     Disposition:   FU with me in 2 months.     Signed, Minus Breeding, MD  08/13/2020  1:26 PM     [facility]

## 2020-08-13 ENCOUNTER — Encounter: Payer: Self-pay | Admitting: Cardiology

## 2020-08-13 ENCOUNTER — Ambulatory Visit (INDEPENDENT_AMBULATORY_CARE_PROVIDER_SITE_OTHER): Payer: Federal, State, Local not specified - PPO

## 2020-08-13 ENCOUNTER — Other Ambulatory Visit: Payer: Self-pay

## 2020-08-13 ENCOUNTER — Ambulatory Visit (HOSPITAL_COMMUNITY)
Admission: EM | Admit: 2020-08-13 | Discharge: 2020-08-13 | Disposition: A | Discharge status: Home / Self Care | Payer: Federal, State, Local not specified - PPO

## 2020-08-13 ENCOUNTER — Ambulatory Visit: Payer: Federal, State, Local not specified - PPO | Admitting: Cardiology

## 2020-08-13 VITALS — BP 144/88 | HR 68 | Ht 68.0 in | Wt 165.0 lb

## 2020-08-13 DIAGNOSIS — R002 Palpitations: Secondary | ICD-10-CM

## 2020-08-13 NOTE — Patient Instructions (Addendum)
Medication Instructions:  Your physician recommends that you continue on your current medications as directed. Please refer to the Current Medication list given to you today.   *If you need a refill on your cardiac medications before your next appointment, please call your pharmacy*  Lab Work: NONE   Testing/Procedures: CALCIUM SCORE - THIS WILL COST $99 OUT OF POCKET [place] [address]  3 DAY ZIO PATCH   Follow-Up: At [facility], you and your health needs are our priority.  As part of our continuing mission to provide you with exceptional heart care, we have created designated Provider Care Teams.  These Care Teams include your primary Cardiologist (physician) and Advanced Practice Providers (APPs -  Physician Assistants and Nurse Practitioners) who all work together to provide you with the care you need, when you need it.  We recommend signing up for the patient portal called "MyChart".  Sign up information is provided on this After Visit Summary.  MyChart is used to connect with patients for Virtual Visits (Telemedicine).  Patients are able to view lab/test results, encounter notes, upcoming appointments, etc.  Non-urgent messages can be sent to your provider as well.   To learn more about what you can do with MyChart, go to [email or web address].    Your next appointment:   2 month(s)  The format for your next appointment:   In Person  Provider:   You may see DR [facility] or one of the following Advanced Practice Providers on your designated Care Team:    Rosaria Ferries, PA-C  Jory Sims, DNP, ANP  Other Instructions  MONITOR YOUR BLOOD PRESSURE DAILY, SEND READINGS IN [place] Monitor Instructions   Your physician has requested you wear a ZIO patch monitor for _3__ days.  This is a single patch monitor.   [employer] supplies one patch monitor per enrollment. Additional stickers are not available. Please do not apply patch  if you will be having a Nuclear Stress Test, Echocardiogram, Cardiac CT, MRI, or Chest Xray during the period you would be wearing the monitor. The patch cannot be worn during these tests. You cannot remove and re-apply the ZIO XT patch monitor.  Your ZIO patch monitor will be sent Fed Ex from [employer] directly to your home address. It may take 3-5 days to receive your monitor after you have been enrolled.  Once you have received your monitor, please review the enclosed instructions. Your monitor has already been registered assigning a specific monitor serial # to you.  Billing and Patient Assistance Program Information   We have supplied [employer] with any of your insurance information on file for billing purposes. [employer] offers a sliding scale Patient Assistance Program for patients that do not have insurance, or whose insurance does not completely cover the cost of the ZIO monitor.   You must apply for the Patient Assistance Program to qualify for this discounted rate.     To apply, please call [employer] at [phone], select option 4, then select option 2, and ask to apply for Patient Assistance Program.  Theodore Demark will ask your household income, and how many people are in your household.  They will quote your out-of-pocket cost based on that information.  [employer] will also be able to set up a 60-month, interest-free payment plan if needed.  Applying the monitor   Shave hair from upper left chest.  Hold abrader disc by orange tab. Rub abrader in 40 strokes over  the upper left chest as indicated in your monitor instructions.  Clean area with 4 enclosed alcohol pads. Let dry.  Apply patch as indicated in monitor instructions. Patch will be placed under collarbone on left side of chest with arrow pointing upward.  Rub patch adhesive wings for 2 minutes. Remove white label marked "1". Remove the white label marked "2". Rub patch adhesive wings for 2 additional minutes.  While looking in a  mirror, press and release button in center of patch. A small green light will flash 3-4 times. This will be your only indicator that the monitor has been turned on. ?  Do not shower for the first 24 hours. You may shower after the first 24 hours.  Press the button if you feel a symptom. You will hear a small click. Record Date, Time and Symptom in the Patient Logbook.  When you are ready to remove the patch, follow instructions on the last 2 pages of the Patient Logbook. Stick patch monitor onto the last page of Patient Logbook.  Place Patient Logbook in the blue and white box.  Use locking tab on box and tape box closed securely.  The blue and white box has prepaid postage on it. Please place it in the mailbox as soon as possible. Your physician should have your test results approximately 7 days after the monitor has been mailed back to [facility].  Call [employer] at [phone] if you have questions regarding your ZIO XT patch monitor. Call them immediately if you see an orange light blinking on your monitor.  If your monitor falls off in less than 4 days, contact our Monitor department at [phone]. ?If your monitor becomes loose or falls off after 4 days call [employer] at [phone] for suggestions on securing your monitor.?

## 2020-08-13 NOTE — Progress Notes (Unsigned)
Patient enrolled for [employer] to ship a 3 day ZIO XT long term holter monitor to his home. 

## 2020-08-17 DIAGNOSIS — R002 Palpitations: Secondary | ICD-10-CM | POA: Diagnosis not present

## 2020-08-31 ENCOUNTER — Encounter: Payer: Self-pay | Admitting: *Deleted

## 2020-09-13 ENCOUNTER — Other Ambulatory Visit: Payer: Self-pay

## 2020-09-13 ENCOUNTER — Ambulatory Visit (INDEPENDENT_AMBULATORY_CARE_PROVIDER_SITE_OTHER)
Admission: RE | Admit: 2020-09-13 | Discharge: 2020-09-13 | Disposition: A | Discharge status: Home / Self Care | Payer: Self-pay | Source: Ambulatory Visit | Attending: Cardiology | Admitting: Cardiology

## 2020-09-13 DIAGNOSIS — R002 Palpitations: Secondary | ICD-10-CM

## 2020-09-20 ENCOUNTER — Encounter: Payer: Self-pay | Admitting: *Deleted

## 2020-10-01 DIAGNOSIS — I491 Atrial premature depolarization: Secondary | ICD-10-CM | POA: Insufficient documentation

## 2020-10-01 DIAGNOSIS — I700 Atherosclerosis of aorta: Secondary | ICD-10-CM | POA: Insufficient documentation

## 2020-10-01 NOTE — Progress Notes (Signed)
Cardiology Office Note   Date:  10/02/2020   ID:  Leonard Bryant, DOB 12-19-1950, MRN [id number]  PCP:  Pcp, No  Cardiologist:   None    Chief Complaint  Patient presents with   Palpitations       History of Present Illness: Leonard Bryant is a 70 y.o. male who is presenting for follow up of ectopy.  Monitor demonstrated rare SVT brief runs of very isolated PVCs.  He had a calcium score which was 0.  He did have some aortic atherosclerosis.  He came back to talk about this today.  He has been doing very well.  He exercises routinely.  He does small jobs to stay active and does some other physical exercises.  He really does not notice palpitations except occasionally at rest and they are isolated and not associated with syncope.  He is not having any chest pressure, neck or arm discomfort.  He has no shortness of breath, PND or orthopnea.    Past Medical History:  Diagnosis Date   Arthritis    Possible arthritis in neck   Hypertension    Kidney stones     Past Surgical History:  Procedure Laterality Date   COLONOSCOPY     15 years ago   HERNIA REPAIR     Inguinal      Current Outpatient Medications  Medication Sig Dispense Refill   amLODipine (NORVASC) 5 MG tablet TAKE 1 TABLET BY MOUTH EVERY DAY 90 tablet 1   aspirin EC 81 MG tablet Take 81 mg by mouth daily.     FIBER PO Take by mouth daily.     Multiple Vitamin (MULTIVITAMIN) tablet Take 1 tablet by mouth daily.     ZYRTEC ALLERGY 10 MG tablet Take 1 tablet (10 mg total) by mouth daily. 30 tablet 11   latanoprost (XALATAN) 0.005 % ophthalmic solution INSTILL 1 DROP IN [facility] EYE AT BEDTIME (Patient not taking: Reported on 10/02/2020)     No current facility-administered medications for this visit.    Allergies:   Pseudoephedrine and Penicillins    ROS:  Please see the history of present illness.   Otherwise, review of systems are positive for none.   All other systems are reviewed and negative.    PHYSICAL EXAM: VS:   BP 138/86 (BP Location: Left Arm, Patient Position: Sitting, Cuff Size: Normal)   Pulse 68   Ht 5\' 8"  (1.727 m)   Wt 180 lb 9.6 oz (81.9 kg)   SpO2 99%   BMI 27.46 kg/m  , BMI Body mass index is 27.46 kg/m. GENERAL:  Well appearing NECK:  No jugular venous distention, waveform within normal limits, carotid upstroke brisk and symmetric, no bruits, no thyromegaly LUNGS:  Clear to auscultation bilaterally CHEST:  Unremarkable HEART:  PMI not displaced or sustained,S1 and S2 within normal limits, no S3, no S4, no clicks, no rubs, no murmurs ABD:  Flat, positive bowel sounds normal in frequency in pitch, no bruits, no rebound, no guarding, no midline pulsatile mass, no hepatomegaly, no splenomegaly EXT:  2 plus pulses throughout, no edema, no cyanosis no clubbing   EKG:  EKG is not ordered today.   Recent Labs: 06/27/2020: BUN 15; Creatinine, Ser 0.81; Hemoglobin 13.3; Platelets 240; Potassium 4.1; Sodium 140; TSH 1.220    Lipid Panel    Component Value Date/Time   CHOL 155 06/27/2020 1504   TRIG 45 06/27/2020 1504   HDL 49 06/27/2020 1504   CHOLHDL  3.2 06/27/2020 1504   CHOLHDL 3.3 03/15/2014 1113   VLDL 15 03/15/2014 1113   LDLCALC 96 06/27/2020 1504   LDLDIRECT 106 (H) 05/05/2012 1032      Wt Readings from Last 3 Encounters:  10/02/20 180 lb 9.6 oz (81.9 kg)  08/13/20 165 lb (74.8 kg)  06/27/20 183 lb (83 kg)      Other studies Reviewed: Additional studies/ records that were reviewed today include: None Review of the above records demonstrates:  Please see elsewhere in the note.     ASSESSMENT AND PLAN:  ECTOPY:   He has SVT some mild other nonsustained arrhythmias.  He prefers not to have any change in therapy as are not particularly symptomatic.   HYPERTENSION:   He wanted to talk about coming off of his blood pressure medicine but we talked about the fact that he is at [facility] because of that as he does not have a lot of therapeutic lifestyle changes to make.  I  would not suggest stopping his antihypertensives.  AORTIC ATHEROSCLEROSIS: He has some mild aortic atherosclerosis but with mild coronary calcium I do not see a compelling reason or guideline directed reason for him to remain on the statin and he would like to discontinue this.  He will stop his Lipitor.  Current medicines are reviewed at length with the patient today.  The patient does not have concerns regarding medicines.  The following changes have been made: As above  Labs/ tests ordered today include:   No orders of the defined types were placed in this encounter.    Disposition:   FU with me as needed Signed, Minus Breeding, MD  10/02/2020 4:43 PM    [facility]

## 2020-10-02 ENCOUNTER — Encounter: Payer: Self-pay | Admitting: Cardiology

## 2020-10-02 ENCOUNTER — Other Ambulatory Visit: Payer: Self-pay

## 2020-10-02 ENCOUNTER — Ambulatory Visit: Payer: Federal, State, Local not specified - PPO | Admitting: Cardiology

## 2020-10-02 VITALS — BP 138/86 | HR 68 | Ht 68.0 in | Wt 180.6 lb

## 2020-10-02 DIAGNOSIS — I491 Atrial premature depolarization: Secondary | ICD-10-CM | POA: Diagnosis not present

## 2020-10-02 DIAGNOSIS — I700 Atherosclerosis of aorta: Secondary | ICD-10-CM | POA: Diagnosis not present

## 2020-10-02 DIAGNOSIS — I10 Essential (primary) hypertension: Secondary | ICD-10-CM

## 2020-10-02 NOTE — Patient Instructions (Signed)
Medication Instructions:  STOP atorvastatin (Lipitor)  *If you need a refill on your cardiac medications before your next appointment, please call your pharmacy*  Follow-Up: At [facility], you and your health needs are our priority.  As part of our continuing mission to provide you with exceptional heart care, we have created designated Provider Care Teams.  These Care Teams include your primary Cardiologist (physician) and Advanced Practice Providers (APPs -  Physician Assistants and Nurse Practitioners) who all work together to provide you with the care you need, when you need it.  We recommend signing up for the patient portal called "MyChart".  Sign up information is provided on this After Visit Summary.  MyChart is used to connect with patients for Virtual Visits (Telemedicine).  Patients are able to view lab/test results, encounter notes, upcoming appointments, etc.  Non-urgent messages can be sent to your provider as well.   To learn more about what you can do with MyChart, go to [email or web address].    Your next appointment:   AS NEEDED with Dr. Percival Spanish

## 2021-01-01 ENCOUNTER — Ambulatory Visit: Payer: Federal, State, Local not specified - PPO | Attending: Internal Medicine | Admitting: Internal Medicine

## 2021-01-01 ENCOUNTER — Other Ambulatory Visit: Payer: Self-pay

## 2021-01-01 ENCOUNTER — Encounter: Payer: Self-pay | Admitting: Internal Medicine

## 2021-01-01 VITALS — BP 160/90 | HR 67 | Ht 68.0 in | Wt 181.6 lb

## 2021-01-01 DIAGNOSIS — R972 Elevated prostate specific antigen [PSA]: Secondary | ICD-10-CM | POA: Insufficient documentation

## 2021-01-01 DIAGNOSIS — H409 Unspecified glaucoma: Secondary | ICD-10-CM | POA: Diagnosis not present

## 2021-01-01 DIAGNOSIS — H9193 Unspecified hearing loss, bilateral: Secondary | ICD-10-CM

## 2021-01-01 DIAGNOSIS — Z2821 Immunization not carried out because of patient refusal: Secondary | ICD-10-CM

## 2021-01-01 DIAGNOSIS — I10 Essential (primary) hypertension: Secondary | ICD-10-CM

## 2021-01-01 MED ORDER — AMLODIPINE BESYLATE 5 MG PO TABS: 5.0000 mg | ORAL_TABLET | Freq: Every day | ORAL | 1 refills | Status: DC

## 2021-01-01 NOTE — Progress Notes (Signed)
Patient ID: Leonard Bryant, male    DOB: 1950/05/08  MRN: [id number]  CC: Hypertension, New Patient (Initial Visit), Hearing Problem (B/l ear ), and Medication Refill   Subjective: Leonard Bryant is a 70 y.o. male who presents for chronic ds and to est with me as PCP.  Previous PCP was Dr. Phill Myron at [facility] who left the practice several months ago.  His concerns today include:  Pt with hx of HTN, seasonal allergies, ED, aortic atherosclerosis, BPH, glaucoma  HYPERTENSION Currently taking: see medication list.  He is on Norvasc 5 mg daily Med Adherence: [x]  Yes    []  No Medication side effects: []  Yes    [x]  No Adherence with salt restriction: [x]  Yes    []  No Home Monitoring?: [x]  Yes  but not in past 1 mth  []  No Monitoring Frequency: 120s/70 Home BP results range:  SOB? []  Yes    [x]  No Chest Pain?: []  Yes    [x]  No Leg swelling?: []  Yes    [x]  No Headaches?: []  Yes    [x]  No Dizziness? []  Yes    [x]  No Comments:   He is requesting to have his ears checked because of decreased hearing over the past 6 months.  Occasional ringing in the ears.  His wife and daughter have told him that he needs hearing aids   I note Xalatan eye drops on med list.  Pt reports being dx with glaucoma 8-9 mths ago. He did not have any eye symptoms at the time.  He has not used the drops. No blurred vision.  Wears glasses.  Wants to get 2nd opinion on glaucoma dx.   I note that he had mild elevation of his PSA on blood test done in March of this year.  The level was 4.1.  Previous levels were 3.5 and 4.6.  HM: had 4 COVID vaccines.  Declines flu shot Patient Active Problem List   Diagnosis Date Noted   Premature atrial contractions 10/01/2020   Aortic atherosclerosis ([place]) 10/01/2020   PVC (premature ventricular contraction) 06/28/2020   Floaters in visual field, right 06/28/2020   History of COVID-19 04/13/2020   Essential hypertension 07/06/2019   Refusal of blood transfusions as patient is  Jehovah's Witness 05/23/2014   BPH (benign prostatic hyperplasia) 03/15/2014   Erectile dysfunction 03/15/2014   Leukopenia 05/06/2012   Allergic rhinitis 05/06/2012   Hyperlipidemia      Current Outpatient Medications on File Prior to Visit  Medication Sig Dispense Refill   aspirin EC 81 MG tablet Take 81 mg by mouth daily.     FIBER PO Take by mouth daily.     latanoprost (XALATAN) 0.005 % ophthalmic solution INSTILL 1 DROP IN [facility] EYE AT BEDTIME (Patient not taking: Reported on 10/02/2020)     Multiple Vitamin (MULTIVITAMIN) tablet Take 1 tablet by mouth daily.     ZYRTEC ALLERGY 10 MG tablet Take 1 tablet (10 mg total) by mouth daily. 30 tablet 11   No current facility-administered medications on file prior to visit.    Allergies  Allergen Reactions   Pseudoephedrine Other (See Comments)    Testicular pain and tenderness   Penicillins Rash    Has patient had a PCN reaction causing immediate rash, facial/tongue/throat swelling, SOB or lightheadedness with hypotension: No Has patient had a PCN reaction causing severe rash involving mucus membranes or skin necrosis: No Has patient had a PCN reaction that required hospitalization No Has patient had a  PCN reaction occurring within the last 10 years: No If all of the above answers are "NO", then may proceed with Cephalosporin use.     Social History   Socioeconomic History   Marital status: Married    Spouse name: Not on file   Number of children: Not on file   Years of education: Not on file   Highest education level: Not on file  Occupational History   Not on file  Tobacco Use   Smoking status: Former    Packs/day: 1.50    Years: 5.00    Pack years: 7.50    Types: Cigarettes   Smokeless tobacco: Never  Vaping Use   Vaping Use: Never used  Substance and Sexual Activity   Alcohol use: Yes    Comment: glass of wine or beer a day   Drug use: No   Sexual activity: Not on file  Other Topics Concern   Not on file   Social History Narrative   Lives wife wife.  Grown children.  Retired post Therapist, nutritional.    Social Determinants of Health   Financial Resource Strain: Not on file  Food Insecurity: Not on file  Transportation Needs: Not on file  Physical Activity: Not on file  Stress: Not on file  Social Connections: Not on file  Intimate Partner Violence: Not on file    Family History  Problem Relation Age of Onset   Diabetes Mother    Heart disease Mother    Stroke Mother    Clotting disorder Sister        Daughter also with clotting disorder.   Colon cancer Neg Hx    Esophageal cancer Neg Hx    Rectal cancer Neg Hx    Stomach cancer Neg Hx     Past Surgical History:  Procedure Laterality Date   COLONOSCOPY     15 years ago   HERNIA REPAIR     Inguinal     ROS: Review of Systems Negative except as stated above  PHYSICAL EXAM: BP (!) 160/90   Pulse 67   Ht 5\' 8"  (1.727 m)   Wt 181 lb 9.6 oz (82.4 kg)   SpO2 (!) 16%   PF 99 L/min   BMI 27.61 kg/m   Physical Exam   General appearance - alert, well appearing, older African-American male and in no distress Mental status - normal mood, behavior, speech, dress, motor activity, and thought processes Eyes - pupils equal and reactive, extraocular eye movements intact Ears - bilateral TM's and external ear canals normal Chest - clear to auscultation, no wheezes, rales or rhonchi, symmetric air entry Heart - normal rate, regular rhythm with occasional ectopy, normal S1, S2, no murmurs, rubs, clicks or gallops Extremities - peripheral pulses normal, no pedal edema, no clubbing or cyanosis  CMP Latest Ref Rng & Units 06/27/2020 12/30/2018 03/18/2017  Glucose 65 - 99 mg/dL 90 96 103(H)  BUN 8 - 27 mg/dL 15 14 17   Creatinine 0.76 - 1.27 mg/dL 0.81 1.04 1.04  Sodium 134 - 144 mmol/L 140 139 143  Potassium 3.5 - 5.2 mmol/L 4.1 4.9 4.9  Chloride 96 - 106 mmol/L 105 104 107(H)  CO2 20 - 29 mmol/L - 26 23  Calcium 8.6  - 10.2 mg/dL 9.8 10.6(H) 10.3(H)  Total Protein 6.0 - 8.5 g/dL 6.7 6.8 7.1  Total Bilirubin 0.0 - 1.2 mg/dL 0.4 0.7 0.5  Alkaline Phos 44 - 121 IU/L 57 62 60  AST 0 - 40  IU/L 35 21 33  ALT 0 - 44 IU/L - 19 29   Lipid Panel     Component Value Date/Time   CHOL 155 06/27/2020 1504   TRIG 45 06/27/2020 1504   HDL 49 06/27/2020 1504   CHOLHDL 3.2 06/27/2020 1504   CHOLHDL 3.3 03/15/2014 1113   VLDL 15 03/15/2014 1113   LDLCALC 96 06/27/2020 1504   LDLDIRECT 106 (H) 05/05/2012 1032    CBC    Component Value Date/Time   WBC 3.1 (L) 06/27/2020 1504   WBC 2.8 (L) 01/02/2016 0930   RBC 4.80 06/27/2020 1504   RBC 5.05 01/02/2016 0930   HGB 13.3 06/27/2020 1504   HCT 39.5 06/27/2020 1504   PLT 240 06/27/2020 1504   MCV 82 06/27/2020 1504   MCH 27.7 06/27/2020 1504   MCH 28.9 01/02/2016 0930   MCHC 33.7 06/27/2020 1504   MCHC 35.4 01/02/2016 0930   RDW 13.7 06/27/2020 1504   LYMPHSABS 1.0 06/27/2020 1504   MONOABS 0.3 12/25/2012 0318   EOSABS 0.1 06/27/2020 1504   BASOSABS 0.0 06/27/2020 1504    ASSESSMENT AND PLAN: 1. Essential hypertension Not at goal.  I recommend increasing amlodipine to 10 mg daily.  Patient wants to hold off.  He states he will start checking his blood pressure again.  Advised to check at least twice a week with goal being 130/80 or lower.  Follow-up with clinical pharmacist in 2 weeks for blood pressure recheck. DASH diet discussed and encouraged. - amLODipine (NORVASC) 5 MG tablet; Take 1 tablet (5 mg total) by mouth daily.  Dispense: 90 tablet; Refill: 1  2. Decreased hearing of both ears - Ambulatory referral to ENT  3. Glaucoma, unspecified glaucoma type, unspecified laterality Will refer him to a different ophthalmologist for second opinion per his request. - Ambulatory referral to Ophthalmology  4. Elevated PSA Went over these results with him.  Advised that we can see mild elevation in patient he stage.  We can either recheck level again in  several months or refer him to urologist at this time.  Patient prefers to just have the level rechecked on subsequent visit.  5. Influenza vaccination declined Recommended.  Patient declined.    Patient was given the opportunity to ask questions.  Patient verbalized understanding of the plan and was able to repeat key elements of the plan.   Orders Placed This Encounter  Procedures   Ambulatory referral to Ophthalmology   Ambulatory referral to ENT     Requested Prescriptions   Signed Prescriptions Disp Refills   amLODipine (NORVASC) 5 MG tablet 90 tablet 1    Sig: Take 1 tablet (5 mg total) by mouth daily.    Return in about 3 months (around 04/03/2021), or physical, for give appt with [facility] in 2 wks for BP recheck.  Karle Plumber, MD, FACP

## 2021-01-15 ENCOUNTER — Other Ambulatory Visit: Payer: Self-pay

## 2021-01-15 ENCOUNTER — Encounter: Payer: Self-pay | Admitting: Pharmacist

## 2021-01-15 ENCOUNTER — Ambulatory Visit: Payer: Federal, State, Local not specified - PPO | Attending: Internal Medicine | Admitting: Pharmacist

## 2021-01-15 VITALS — BP 152/83

## 2021-01-15 DIAGNOSIS — I10 Essential (primary) hypertension: Secondary | ICD-10-CM | POA: Diagnosis not present

## 2021-01-15 NOTE — Progress Notes (Signed)
   S:    PCP: Dr. Wynetta Emery   Patient arrives in good spirits. Presents to the clinic for hypertension evaluation, counseling, and management. Patient was referred and last seen by Primary Care Provider on 01/01/2021.   Medication adherence reported. Has taken his amlodipine today.   Current BP Medications include:  amlodipine 5 mg daily.   Dietary habits include: patient is mostly compliant with salt restriction. Will sometimes add salt when cooking. Drinks two 10-oz cups of coffee throughout the day.  Exercise habits include: active through his job.  Family / Social history:  - Fhx: DM, heart disease, stroke  - Tobacco: former smoker  - Alcohol: none reported    O:  Vitals:   01/15/21 0958  BP: (!) 152/83   Home BP readings:  10/24/20: 110/69, HR 64 10/25/20: 113/73, HR 76 11/02/20: 108/71, HR 68 11/03/20: 110/73, HR 72  11/12/20:108/72, HR 76 11/22/20: 118/79, HR 64  No readings for September. 12/31/2020: 124/76, HR 72 01/06/2021: 121/75, HR 74  01/12/2021: 135/78, HR 61 01/14/2021: 124/77, HR 61  01/14/2021: 123/69, HR 57   Last 3 Office BP readings: BP Readings from Last 3 Encounters:  01/01/21 (!) 160/90  10/02/20 138/86  08/13/20 (!) 144/88    BMET    Component Value Date/Time   NA 140 06/27/2020 1504   K 4.1 06/27/2020 1504   CL 105 06/27/2020 1504   CO2 26 12/30/2018 1044   GLUCOSE 90 06/27/2020 1504   GLUCOSE 99 01/02/2016 0930   BUN 15 06/27/2020 1504   CREATININE 0.81 06/27/2020 1504   CREATININE 0.96 01/02/2016 0930   CALCIUM 9.8 06/27/2020 1504   GFRNONAA 74 12/30/2018 1044   GFRNONAA 84 01/02/2016 0930   GFRAA 86 12/30/2018 1044   GFRAA >89 01/02/2016 0930    Renal function: CrCl cannot be calculated (Patient's most recent lab result is older than the maximum 21 days allowed.).  Clinical ASCVD: No  The 10-year ASCVD risk score (Arnett DK, et al., 2019) is: 24.4%   Values used to calculate the score:     Age: 70 years     Sex: Male     Is  Non-Hispanic African American: Yes     Diabetic: No     Tobacco smoker: No     Systolic Blood Pressure: 947 mmHg     Is BP treated: Yes     HDL Cholesterol: 49 mg/dL     Total Cholesterol: 155 mg/dL   A/P: Hypertension longstanding currently above goal based on today's BP on current medications. BP Goal = < 130/80 mmHg. Medication adherence reported. OF note, his BP at home is consistently at goal. He tells me today that his BP in clinic is always higher than it is at home. Offered to see him again in 1 month with his home cuff. Will hold off on changes now.  -Continued amlodipine 5 mg daily.  -Counseled on lifestyle modifications for blood pressure control including reduced dietary sodium, increased exercise, adequate sleep.  Results reviewed and written information provided.   Total time in face-to-face counseling 15 minutes.   F/U Clinic Visit in 1 month.    Benard Halsted, PharmD, [facility] [place], [place] [phone]

## 2021-02-13 ENCOUNTER — Other Ambulatory Visit: Payer: Self-pay

## 2021-02-13 ENCOUNTER — Encounter: Payer: Self-pay | Admitting: Pharmacist

## 2021-02-13 ENCOUNTER — Ambulatory Visit: Payer: Federal, State, Local not specified - PPO | Attending: Family Medicine | Admitting: Pharmacist

## 2021-02-13 VITALS — BP 145/93

## 2021-02-13 DIAGNOSIS — I10 Essential (primary) hypertension: Secondary | ICD-10-CM

## 2021-02-13 MED ORDER — ROSUVASTATIN CALCIUM 10 MG PO TABS: 10.0000 mg | ORAL_TABLET | Freq: Every day | ORAL | 0 refills | Status: DC

## 2021-02-13 NOTE — Progress Notes (Signed)
   S:    Patient arrives in good spirits. Presents to the clinic for hypertension evaluation, counseling, and management. Patient was referred and last seen by Primary Care Provider on 01/01/2021. Patient last seen by CPP on 01/15/2021 where patient reported home blood pressure monitoring within normal limits, however presented to clinic with elevated blood pressure. No changes were made, so the patient was instructed to return to clinic in 1 month to report home blood pressure readings.  Medication adherence reported.  Current BP Medications include:   -Amlodipine 5 mg daily  Antihypertensives tried in the past include: none  Dietary habits include: incorporating more vegetables and lean meats. Patient reported limiting salt intake, but cooks with [employer]'s creole seasoning. Patient was encouraged to swap to [employer]'s salt free version. Exercise habits include:none Family / Social history:  -Former smoker -Mother: Diabetes, heart disease, stroke  ASCVD risk factors include:HTN, aortic atherosclerosis, HLD  O:   Patient may have a component of white coat hypertension. Previous readings from home are normal, but consistently elevated in clinic. Patient brought meter to clinic today and the meter showed the following readings:  Unknown date 124/76 11/2 109/66 10/21 134/80 10/21 144/78 10/20 112/65  After the patient was sitting for <5 minutes, blood pressure was taken on his home machine and our clinic device. His machine read 149/105 and the clinic machine read 145/93. These numbers do correlate well. Additionally, patient checked his blood pressure once I left the room and it read 130/79.   Last 3 Office BP readings: BP Readings from Last 3 Encounters:  01/15/21 (!) 152/83  01/01/21 (!) 160/90  10/02/20 138/86    BMET    Component Value Date/Time   NA 140 06/27/2020 1504   K 4.1 06/27/2020 1504   CL 105 06/27/2020 1504   CO2 26 12/30/2018 1044   GLUCOSE 90 06/27/2020 1504    GLUCOSE 99 01/02/2016 0930   BUN 15 06/27/2020 1504   CREATININE 0.81 06/27/2020 1504   CREATININE 0.96 01/02/2016 0930   CALCIUM 9.8 06/27/2020 1504   GFRNONAA 74 12/30/2018 1044   GFRNONAA 84 01/02/2016 0930   GFRAA 86 12/30/2018 1044   GFRAA >89 01/02/2016 0930    Renal function: CrCl cannot be calculated (Patient's most recent lab result is older than the maximum 21 days allowed.).  Clinical ASCVD: aortic atherosclerosis The 10-year ASCVD risk score (Arnett DK, et al., 2019) is: 35.6%   Values used to calculate the score:     Age: 70 years     Sex: Male     Is Non-Hispanic African American: Yes     Diabetic: No     Tobacco smoker: Yes     Systolic Blood Pressure: 885 mmHg     Is BP treated: Yes     HDL Cholesterol: 49 mg/dL     Total Cholesterol: 155 mg/dL   A/P: Hypertension longstanding currently controlled at home on current medications. BP Goal = < 130/80 mmHg. Medication adherence reported.  -Continued amlodipine 5 mg daily.  -Counseled on lifestyle modifications for blood pressure control including reduced dietary sodium, increased exercise, adequate sleep. -Rosuvastatin 10 mg daily added for ASCVD risk >20% -Check lipid panel/CMP in 6-8 weeks.  Results reviewed and written information provided. Total time in face-to-face counseling 30 minutes. F/U Clinic Visit with PCP in January and obtain labs at that visit

## 2021-05-27 ENCOUNTER — Other Ambulatory Visit: Payer: Self-pay | Admitting: Family Medicine

## 2021-06-01 ENCOUNTER — Encounter (HOSPITAL_COMMUNITY): Payer: Self-pay | Admitting: Emergency Medicine

## 2021-06-01 ENCOUNTER — Ambulatory Visit (HOSPITAL_COMMUNITY)
Admission: EM | Admit: 2021-06-01 | Discharge: 2021-06-01 | Disposition: A | Discharge status: Home / Self Care | Payer: Federal, State, Local not specified - PPO | Attending: Emergency Medicine | Admitting: Emergency Medicine

## 2021-06-01 ENCOUNTER — Other Ambulatory Visit: Payer: Self-pay | Admitting: Internal Medicine

## 2021-06-01 ENCOUNTER — Other Ambulatory Visit: Payer: Self-pay

## 2021-06-01 DIAGNOSIS — M19031 Primary osteoarthritis, right wrist: Secondary | ICD-10-CM | POA: Insufficient documentation

## 2021-06-01 DIAGNOSIS — M19041 Primary osteoarthritis, right hand: Secondary | ICD-10-CM | POA: Insufficient documentation

## 2021-06-01 LAB — CBC WITH DIFFERENTIAL/PLATELET
Abs Immature Granulocytes: 0.02 10*3/uL (ref 0.00–0.07)
Basophils Absolute: 0 10*3/uL (ref 0.0–0.1)
Basophils Relative: 0 %
Eosinophils Absolute: 0.1 10*3/uL (ref 0.0–0.5)
Eosinophils Relative: 1 %
HCT: 42.6 % (ref 39.0–52.0)
Hemoglobin: 14.1 g/dL (ref 13.0–17.0)
Immature Granulocytes: 0 %
Lymphocytes Relative: 16 %
Lymphs Abs: 1.1 10*3/uL (ref 0.7–4.0)
MCH: 28.3 pg (ref 26.0–34.0)
MCHC: 33.1 g/dL (ref 30.0–36.0)
MCV: 85.5 fL (ref 80.0–100.0)
Monocytes Absolute: 0.5 10*3/uL (ref 0.1–1.0)
Monocytes Relative: 7 %
Neutro Abs: 5.1 10*3/uL (ref 1.7–7.7)
Neutrophils Relative %: 76 %
Platelets: 187 10*3/uL (ref 150–400)
RBC: 4.98 MIL/uL (ref 4.22–5.81)
RDW: 14.1 % (ref 11.5–15.5)
WBC: 6.8 10*3/uL (ref 4.0–10.5)
nRBC: 0 % (ref 0.0–0.2)

## 2021-06-01 LAB — BASIC METABOLIC PANEL
Anion gap: 6 (ref 5–15)
BUN: 15 mg/dL (ref 8–23)
CO2: 26 mmol/L (ref 22–32)
Calcium: 10.4 mg/dL — ABNORMAL HIGH (ref 8.9–10.3)
Chloride: 107 mmol/L (ref 98–111)
Creatinine, Ser: 0.58 mg/dL — ABNORMAL LOW (ref 0.61–1.24)
GFR, Estimated: >60 mL/min (ref >60)
Glucose, Bld: 57 mg/dL — ABNORMAL LOW (ref 70–99)
Potassium: 4.1 mmol/L (ref 3.5–5.1)
Sodium: 139 mmol/L (ref 135–145)

## 2021-06-01 LAB — URIC ACID: Uric Acid, Serum: 3.6 mg/dL — ABNORMAL LOW (ref 3.7–8.6)

## 2021-06-01 LAB — SEDIMENTATION RATE: Sed Rate: 8 mm/hr (ref 0–16)

## 2021-06-01 MED ORDER — METHYLPREDNISOLONE SODIUM SUCC 125 MG IJ SOLR
125.0000 mg | Freq: Once | INTRAMUSCULAR | Status: AC
  Administered 2021-06-01: 125 mg via INTRAMUSCULAR

## 2021-06-01 MED ORDER — METHYLPREDNISOLONE 4 MG PO TBPK: ORAL_TABLET | ORAL | 0 refills | Status: DC

## 2021-06-01 NOTE — Discharge Instructions (Addendum)
Based on the history that you provided to me today as well as my physical exam findings, I believe it is possible you are suffering from acute inflammation secondary to the work that you been doing or possible gout flare. ? ?The steroid injection that you are provided during your visit today will address the acute inflammation from either 1 of these possible diagnoses. ? ?The blood work that we performed will evaluate your uric acid level which is elevated with gout flares, a sedimentation rate which is elevated when there is infection, a complete blood cell count which will evaluate you for significantly elevated white blood cells which is concerning for acute infection and a metabolic panel which will evaluate you for any underlying kidney dysfunction contributing to your swelling. ? ?The results of your blood work should be back by tomorrow around this time once I receive the results, we will better know how to move forward with addressing your issue. ? ?In the meantime, please begin taking methylprednisolone by mouth with your breakfast meal tomorrow morning.  I provided you with a Dosepak that has a full 6-day treatment but depending on your lab results, we may discontinue this early.  I do recommend that you begin with the first dose which contains 6 tablets.  Please take them all together with your breakfast meal, do not attempt to space them out through the day with other meals, they will not work as well if taken that way. ? ?Thank you for visiting urgent care today.  Look forward to speaking with you tomorrow and I appreciate the opportunity to participate in your care. ?

## 2021-06-01 NOTE — ED Triage Notes (Signed)
Pt reports wrist pain that radiates up into the arm. Pt states the pain began in the palm of his right hand Wednesday after hanging pictures and now pain has gotten progressively worse.  ?

## 2021-06-01 NOTE — ED Provider Notes (Signed)
[place]    CSN: [id number] Arrival date & time: 06/01/21  1033    HISTORY   Chief Complaint  Patient presents with   Arm Pain   HPI Leonard Bryant is a 71 y.o. male. Pt reports wrist pain that radiates up into the arm. Pt states the pain began in the palm of his right hand Wednesday after hanging pictures and now pain has gotten progressively worse.  Patient states that progressive swelling and redness of his wrist that is radiating to his hand as well as up his arm.  Patient states he has decreased grip strength at this point secondary to the swelling and pain.  Patient states he has never had this before.  Patient denies a history of gout.  Patient denies history of laceration or traumatic injury to the hand.  The history is provided by the patient.  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   Premature atrial contractions 10/01/2020   Aortic atherosclerosis ([place]) 10/01/2020   PVC (premature ventricular contraction) 06/28/2020   Floaters in visual field, right 06/28/2020   History of COVID-19 04/13/2020   Essential hypertension 07/06/2019   Refusal of blood transfusions as patient is Jehovah's Witness 05/23/2014   BPH (benign prostatic hyperplasia) 03/15/2014   Erectile dysfunction 03/15/2014   Leukopenia 05/06/2012   Allergic rhinitis 05/06/2012   Hyperlipidemia    Past Surgical History:  Procedure Laterality Date   COLONOSCOPY     15 years ago   HERNIA REPAIR     Inguinal     Home Medications    Prior to Admission medications   Medication Sig Start Date End Date Taking? Authorizing Provider  amLODipine (NORVASC) 5 MG tablet Take 1 tablet (5 mg total) by mouth daily. 01/01/21   Ladell Pier, MD  aspirin EC 81 MG tablet Take 81 mg  by mouth daily.    [provider]  FIBER PO Take by mouth daily.    [provider]  latanoprost (XALATAN) 0.005 % ophthalmic solution INSTILL 1 DROP IN [facility] AT BEDTIME Patient not taking: Reported on 10/02/2020 01/16/20   [provider]  Multiple Vitamin (MULTIVITAMIN) tablet Take 1 tablet by mouth daily.    [provider]  rosuvastatin (CRESTOR) 10 MG tablet TAKE 1 TABLET BY MOUTH EVERY DAY 05/27/21   Ladell Pier, MD  ZYRTEC ALLERGY 10 MG tablet Take 1 tablet (10 mg total) by mouth daily. 04/12/20   [place], Loraine Grip, PA-C    Family History Family History  Problem Relation Age of Onset   Diabetes Mother    Heart disease Mother    Stroke Mother    Clotting disorder Sister        Daughter also with clotting disorder.   Colon cancer Neg Hx    Esophageal cancer Neg Hx    Rectal cancer Neg Hx    Stomach cancer Neg Hx    Social History Social History   Tobacco Use   Smoking status: Former    Packs/day: 1.50    Years: 5.00    Pack years: 7.50    Types: Cigarettes   Smokeless tobacco: Never  Vaping Use   Vaping Use: Never used  Substance Use Topics  Alcohol use: Yes    Comment: glass of wine or beer a day   Drug use: No   Allergies   Pseudoephedrine and Penicillins  Review of Systems Review of Systems Pertinent findings noted in history of present illness.   Physical Exam Triage Vital Signs ED Triage Vitals  Enc Vitals Group     BP 01/25/21 0827 (!) 147/82     Pulse Rate 01/25/21 0827 72     Resp 01/25/21 0827 18     Temp 01/25/21 0827 98.3 F (36.8 C)     Temp Source 01/25/21 0827 Oral     SpO2 01/25/21 0827 98 %     Weight --      Height --      Head Circumference --      Peak Flow --      Pain Score 01/25/21 0826 5     Pain Loc --      Pain Edu? --      Excl. in [place]? --   No data found.  Updated Vital Signs BP (!) 158/84 (BP Location: Left Arm)    Pulse 73    Temp 98.2 F (36.8 C) (Oral)    Resp 16    Ht 5'  8" (1.727 m)    Wt 181 lb 10.5 oz (82.4 kg)    SpO2 100%    BMI 27.62 kg/m   Physical Exam Vitals and nursing note reviewed.  Constitutional:      General: He is not in acute distress.    Appearance: Normal appearance. He is not ill-appearing.  HENT:     Head: Normocephalic and atraumatic.  Eyes:     General: Lids are normal.        Right eye: No discharge.        Left eye: No discharge.     Extraocular Movements: Extraocular movements intact.     Conjunctiva/sclera: Conjunctivae normal.     Right eye: Right conjunctiva is not injected.     Left eye: Left conjunctiva is not injected.  Neck:     Trachea: Trachea and phonation normal.  Cardiovascular:     Rate and Rhythm: Normal rate and regular rhythm.     Pulses: Normal pulses.     Heart sounds: Normal heart sounds. No murmur heard.   No friction rub. No gallop.  Pulmonary:     Effort: Pulmonary effort is normal. No accessory muscle usage, prolonged expiration or respiratory distress.     Breath sounds: Normal breath sounds. No stridor, decreased air movement or transmitted upper airway sounds. No decreased breath sounds, wheezing, rhonchi or rales.  Chest:     Chest wall: No tenderness.  Musculoskeletal:        General: Swelling (With erythema diffuse across right wrist and right hand and all digits on right hand.  Warmth also appreciated.), tenderness and deformity present. No signs of injury. Normal range of motion.     Cervical back: Normal range of motion and neck supple. Normal range of motion.  Lymphadenopathy:     Cervical: No cervical adenopathy.  Skin:    General: Skin is warm and dry.     Findings: No erythema or rash.  Neurological:     General: No focal deficit present.     Mental Status: He is alert and oriented to person, place, and time.  Psychiatric:        Mood and Affect: Mood normal.        Behavior: Behavior normal.  Visual Acuity Right Eye Distance:   Left Eye Distance:   Bilateral Distance:     Right Eye Near:   Left Eye Near:    Bilateral Near:     UC Couse / Diagnostics / Procedures:    EKG  Radiology No results found.  Procedures Procedures (including critical care time)  UC Diagnoses / Final Clinical Impressions(s)   I have reviewed the triage vital signs and the nursing notes.  Pertinent labs & imaging results that were available during my care of the patient were reviewed by me and considered in my medical decision making (see chart for details).    Final diagnoses:  Joint inflammation of right hand and wrist   Labs performed to check CBC uric acid level, sed rate and BMP.  Patient provided with an injection of methylprednisolone in the office and prescription for an oral dose as well.  Patient advised that we will notify him with results of his labs once they are complete, advised that the results will tailor the next step for treatment.  If uric acid level is elevated, we will provide him with a prescription for colchicine and continue the steroids.  If his sed rate and WBCs are elevated, will provide patient with antibiotics.  Patient strongly encouraged to follow-up with primary care provider soon as possible. ED Prescriptions     Medication Sig Dispense Auth. Provider   methylPREDNISolone (MEDROL DOSEPAK) 4 MG TBPK tablet Take 24 mg on day 1, 20 mg on day 2, 16 mg on day 3, 12 mg on day 4, 8 mg on day 5, 4 mg on day 6. 21 tablet Lynden Oxford Scales, PA-C      PDMP not reviewed this encounter.  Pending results:  Labs Reviewed  BASIC METABOLIC PANEL - Abnormal; Notable for the following components:      Result Value   Glucose, Bld 57 (*)    Creatinine, Ser 0.58 (*)    Calcium 10.4 (*)    All other components within normal limits  URIC ACID - Abnormal; Notable for the following components:   Uric Acid, Serum 3.6 (*)    All other components within normal limits  CBC WITH DIFFERENTIAL/PLATELET  SEDIMENTATION RATE    Medications Ordered in  UC: Medications  methylPREDNISolone sodium succinate (SOLU-MEDROL) 125 mg/2 mL injection 125 mg (125 mg Intramuscular Given 06/01/21 1241)    Disposition Upon Discharge:  Condition: stable for discharge home Home: take medications as prescribed; routine discharge instructions as discussed; follow up as advised.  Patient presented with an acute illness with associated systemic symptoms and significant discomfort requiring urgent management. In my opinion, this is a condition that a prudent lay person (someone who possesses an average knowledge of health and medicine) may potentially expect to result in complications if not addressed urgently such as respiratory distress, impairment of bodily function or dysfunction of bodily organs.   Routine symptom specific, illness specific and/or disease specific instructions were discussed with the patient and/or caregiver at length.   As such, the patient has been evaluated and assessed, work-up was performed and treatment was provided in alignment with urgent care protocols and evidence based medicine.  Patient/parent/caregiver has been advised that the patient may require follow up for further testing and treatment if the symptoms continue in spite of treatment, as clinically indicated and appropriate.  If the patient was tested for COVID-19, Influenza and/or RSV, then the patient/parent/guardian was advised to isolate at home pending the results of his/her diagnostic coronavirus  test and potentially longer if theyre positive. I have also advised pt that if his/her COVID-19 test returns positive, it's recommended to self-isolate for at least 10 days after symptoms first appeared AND until fever-free for 24 hours without fever reducer AND other symptoms have improved or resolved. Discussed self-isolation recommendations as well as instructions for household member/close contacts as per the [facility] and [place] DHHS, and also gave patient the [employer] packet with this  information.  Patient/parent/caregiver has been advised to return to the [facility] or PCP in 3-5 days if no better; to PCP or the Emergency Department if new signs and symptoms develop, or if the current signs or symptoms continue to change or worsen for further workup, evaluation and treatment as clinically indicated and appropriate  The patient will follow up with their current PCP if and as advised. If the patient does not currently have a PCP we will assist them in obtaining one.   The patient may need specialty follow up if the symptoms continue, in spite of conservative treatment and management, for further workup, evaluation, consultation and treatment as clinically indicated and appropriate.   Patient/parent/caregiver verbalized understanding and agreement of plan as discussed.  All questions were addressed during visit.  Please see discharge instructions below for further details of plan.  Discharge Instructions:   Discharge Instructions      Based on the history that you provided to me today as well as my physical exam findings, I believe it is possible you are suffering from acute inflammation secondary to the work that you been doing or possible gout flare.  The steroid injection that you are provided during your visit today will address the acute inflammation from either 1 of these possible diagnoses.  The blood work that we performed will evaluate your uric acid level which is elevated with gout flares, a sedimentation rate which is elevated when there is infection, a complete blood cell count which will evaluate you for significantly elevated white blood cells which is concerning for acute infection and a metabolic panel which will evaluate you for any underlying kidney dysfunction contributing to your swelling.  The results of your blood work should be back by tomorrow around this time once I receive the results, we will better know how to move forward with addressing your  issue.  In the meantime, please begin taking methylprednisolone by mouth with your breakfast meal tomorrow morning.  I provided you with a Dosepak that has a full 6-day treatment but depending on your lab results, we may discontinue this early.  I do recommend that you begin with the first dose which contains 6 tablets.  Please take them all together with your breakfast meal, do not attempt to space them out through the day with other meals, they will not work as well if taken that way.  Thank you for visiting urgent care today.  Look forward to speaking with you tomorrow and I appreciate the opportunity to participate in your care.    This office note has been dictated using Museum/gallery curator.  Unfortunately, and despite my best efforts, this method of dictation can sometimes lead to occasional typographical or grammatical errors.  I apologize in advance if this occurs.     Lynden Oxford Scales, PA-C 06/01/21 1821

## 2021-06-03 NOTE — Telephone Encounter (Signed)
Called pt and LMOM requesting c/b to make an appt.  ?Sent "MyChart" message. ?

## 2021-06-03 NOTE — Telephone Encounter (Signed)
Requested medications are due for refill today.  yes ? ?Requested medications are on the active medications list.  yes ? ?Last refill. 05/27/2021 #30 0 refills ? ?Future visit scheduled.   no ? ?Notes to clinic.  Courtesy refills already given. ? ? ? ?Requested Prescriptions  ?Pending Prescriptions Disp Refills  ? rosuvastatin (CRESTOR) 10 MG tablet [Pharmacy Med Name: ROSUVASTATIN CALCIUM 10 MG TAB] 90 tablet 1  ?  Sig: TAKE 1 TABLET BY MOUTH EVERY DAY  ?  ? Cardiovascular:  Antilipid - Statins 2 Failed - 06/03/2021  2:36 PM  ?  ?  Failed - Cr in normal range and within 360 days  ?  Creat  ?Date Value Ref Range Status  ?01/02/2016 0.96 0.70 - 1.25 mg/dL Final  ?  Comment:  ?    ?For patients > or = 70 years of age: The upper reference limit for ?Creatinine is approximately 13% higher for people identified as ?African-American. ?  ?  ? ?Creatinine, Ser  ?Date Value Ref Range Status  ?06/01/2021 0.58 (L) 0.61 - 1.24 mg/dL Final  ?  ?  ?  ?  Failed - Lipid Panel in normal range within the last 12 months  ?  Cholesterol, Total  ?Date Value Ref Range Status  ?06/27/2020 155 100 - 199 mg/dL Final  ? ?LDL Chol Calc (NIH)  ?Date Value Ref Range Status  ?06/27/2020 96 0 - 99 mg/dL Final  ? ?Direct LDL  ?Date Value Ref Range Status  ?05/05/2012 106 (H) mg/dL Final  ?  Comment:  ?  ATP III Classification (LDL): ?      < 100        mg/dL         Optimal ?     100 - 129     mg/dL         Near or Above Optimal ?     130 - 159     mg/dL         Borderline High ?     160 - 189     mg/dL         High ?      > 190        mg/dL         Very High ?   ? ?HDL  ?Date Value Ref Range Status  ?06/27/2020 49 >39 mg/dL Final  ? ?Triglycerides  ?Date Value Ref Range Status  ?06/27/2020 45 0 - 149 mg/dL Final  ? ?  ?  ?  Passed - Patient is not pregnant  ?  ?  Passed - Valid encounter within last 12 months  ?  Recent Outpatient Visits   ? ?      ? 3 months ago Essential hypertension  ? [place], RPH-CPP  ? 4 months ago Essential hypertension  ? [place], RPH-CPP  ? 5 months ago Essential hypertension  ? [place] Ladell Pier, MD  ? 4 years ago Physical exam  ? Primary Care at [place], [place], [place]  ? 5 years ago Annual physical exam  ? Primary Care at [place], [place], [place]  ? ?  ?  ? ?  ?  ?  ?  ?

## 2021-06-28 ENCOUNTER — Other Ambulatory Visit: Payer: Self-pay | Admitting: Internal Medicine

## 2021-06-28 DIAGNOSIS — I10 Essential (primary) hypertension: Secondary | ICD-10-CM

## 2021-09-26 ENCOUNTER — Other Ambulatory Visit: Payer: Self-pay | Admitting: Internal Medicine

## 2021-09-26 DIAGNOSIS — I10 Essential (primary) hypertension: Secondary | ICD-10-CM

## 2021-09-26 NOTE — Telephone Encounter (Signed)
Requested Prescriptions  Pending Prescriptions Disp Refills  . amLODipine (NORVASC) 5 MG tablet [Pharmacy Med Name: AMLODIPINE BESYLATE 5 MG TAB] 90 tablet 0    Sig: TAKE 1 TABLET (5 MG TOTAL) BY MOUTH DAILY.     Cardiovascular: Calcium Channel Blockers 2 Failed - 09/26/2021  4:29 PM      Failed - Last BP in normal range    BP Readings from Last 1 Encounters:  06/01/21 (!) 158/84         Failed - Valid encounter within last 6 months    Recent Outpatient Visits          7 months ago Essential hypertension   [place], Jarome Matin, RPH-CPP   8 months ago Essential hypertension   [place], Jarome Matin, RPH-CPP   8 months ago Essential hypertension   [place], [place]   1 year ago Seasonal allergic rhinitis due to other allergic trigger   Primary Care at [facility], Loraine Grip, PA-C   2 years ago Essential hypertension   Primary Care at [facility], Bayard Beaver, MD      Future Appointments            In 3 weeks Ladell Pier, MD [place] in normal range    Pulse Readings from Last 1 Encounters:  06/01/21 73

## 2021-09-26 NOTE — Telephone Encounter (Signed)
Apt 10/22/21

## 2021-09-26 NOTE — Telephone Encounter (Signed)
Requested medication (s) are due for refill today - yes  Requested medication (s) are on the active medication list -yes  Future visit scheduled -no  Last refill: 06/28/21 #90  Notes to clinic: Attempted to call patient to schedule appointment- left message to call office. Second attempt to contact patient for appointment- last RF has notes- sent for review.  Requested Prescriptions  Pending Prescriptions Disp Refills   amLODipine (NORVASC) 5 MG tablet [Pharmacy Med Name: AMLODIPINE BESYLATE 5 MG TAB] 90 tablet 0    Sig: TAKE 1 TABLET (5 MG TOTAL) BY MOUTH DAILY.     Cardiovascular: Calcium Channel Blockers 2 Failed - 09/26/2021  3:52 PM      Failed - Last BP in normal range    BP Readings from Last 1 Encounters:  06/01/21 (!) 158/84         Failed - Valid encounter within last 6 months    Recent Outpatient Visits           7 months ago Essential hypertension   [place], Jarome Matin, RPH-CPP   8 months ago Essential hypertension   [place], RPH-CPP   8 months ago Essential hypertension   [place] Ladell Pier, MD   1 year ago Seasonal allergic rhinitis due to other allergic trigger   Primary Care at [facility], Loraine Grip, PA-C   2 years ago Essential hypertension   Primary Care at [facility], Bayard Beaver, MD              Passed - Last Heart Rate in normal range    Pulse Readings from Last 1 Encounters:  06/01/21 73            Requested Prescriptions  Pending Prescriptions Disp Refills   amLODipine (NORVASC) 5 MG tablet [Pharmacy Med Name: AMLODIPINE BESYLATE 5 MG TAB] 90 tablet 0    Sig: TAKE 1 TABLET (5 MG TOTAL) BY MOUTH DAILY.     Cardiovascular: Calcium Channel Blockers 2 Failed - 09/26/2021  3:52 PM      Failed - Last BP in normal range    BP Readings from Last 1 Encounters:  06/01/21 (!) 158/84          Failed - Valid encounter within last 6 months    Recent Outpatient Visits           7 months ago Essential hypertension   [place], Jarome Matin, RPH-CPP   8 months ago Essential hypertension   [place], RPH-CPP   8 months ago Essential hypertension   [place] Ladell Pier, MD   1 year ago Seasonal allergic rhinitis due to other allergic trigger   Primary Care at [facility], Loraine Grip, PA-C   2 years ago Essential hypertension   [facility] at [facility], MD              Passed - Last Heart Rate in normal range    Pulse Readings from Last 1 Encounters:  06/01/21 73

## 2021-09-30 IMAGING — CT CT CARDIAC CORONARY ARTERY CALCIUM SCORE
3 series · 14 of 20 positions shown, 15 images · non-contrast
Comparison: None.
COMPARISON: None.

Addendum:
EXAM:
OVER-READ INTERPRETATION  CT CHEST

The following report is an over-read performed by radiologist Dr.
Paulus N Ceejay [REDACTED] on 09/13/2020. This
over-read does not include interpretation of cardiac or coronary
anatomy or pathology. The coronary calcium score interpretation by
the cardiologist is attached.
CLINICAL DATA: Cardiovascular Disease Risk stratification
Coronary Calcium Score
TECHNIQUE: A gated, non-contrast computed tomography scan of the heart was
performed using 3mm slice thickness. Axial images were analyzed on a
dedicated workstation. Calcium scoring of the coronary arteries was
performed using the Agatston method.

[Series 2: casc 3.0 bv41 2 bestsyst 254 ms · axial · 0.41mm/px · z∈[-215,-152]mm · 4 of 35 slices shown, 5 images]
[im 7/35  vessel]
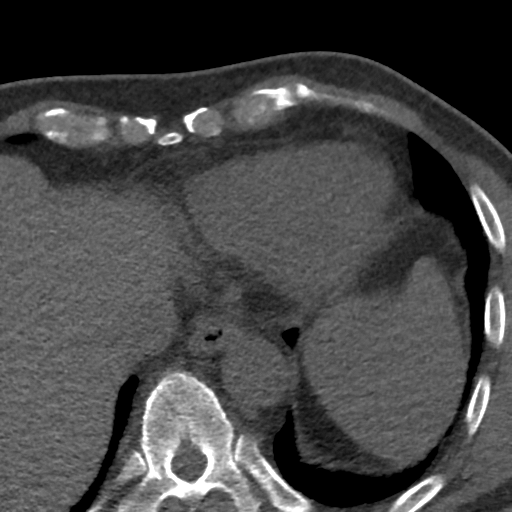
[im 7/35  lung]
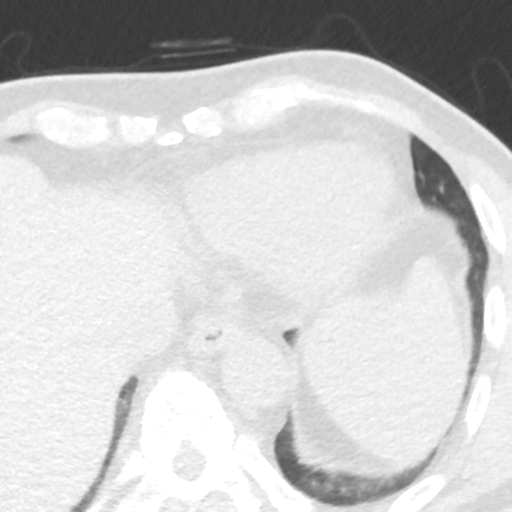
[im 14/35  vessel]
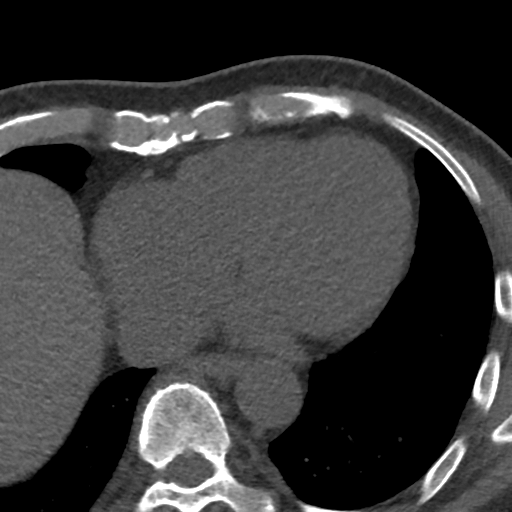
[im 21/35  vessel]
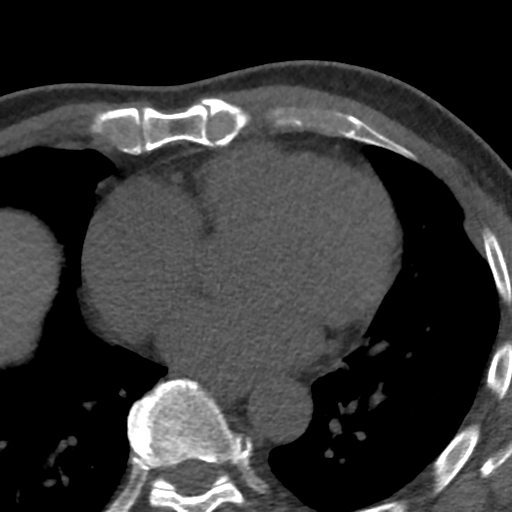
[im 28/35  vessel]
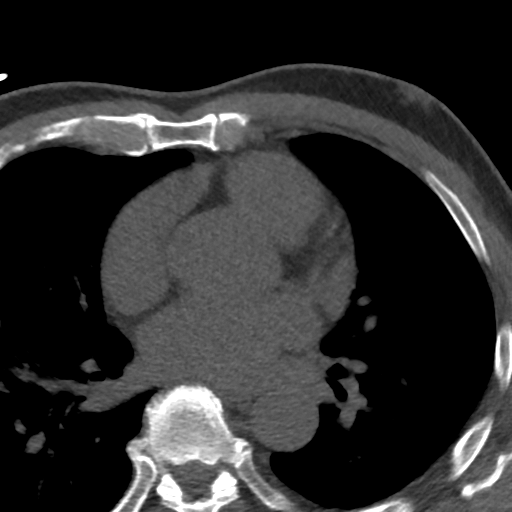

[Series 3: lung 253 ms · axial · 0.68mm/px · z∈[-218,-149]mm · 5 of 35 slices shown]
[im 6/35  lung]
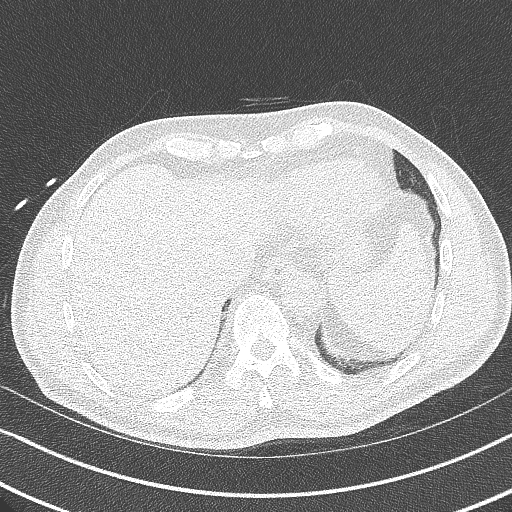
[im 12/35  lung]
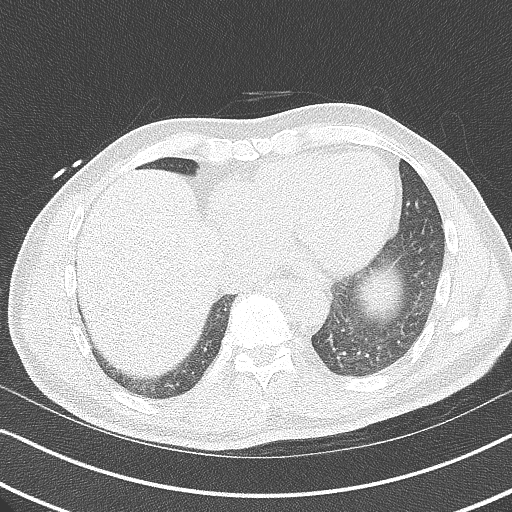
[im 18/35  lung]
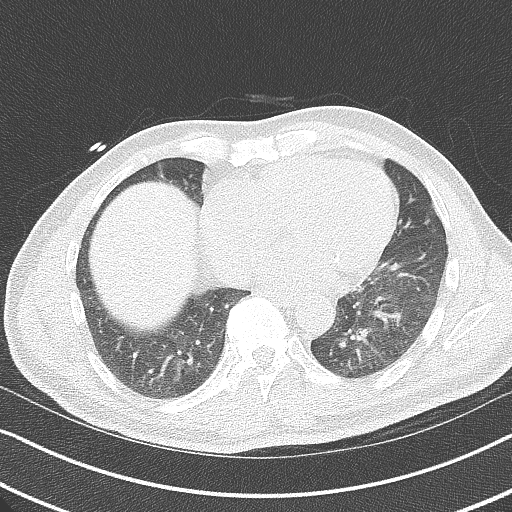
[im 23/35  lung]
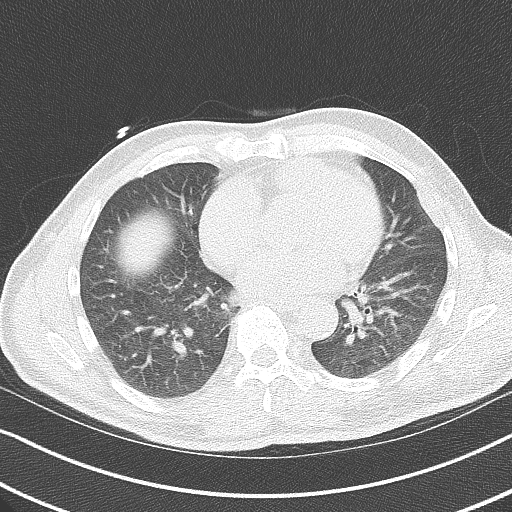
[im 29/35  lung]
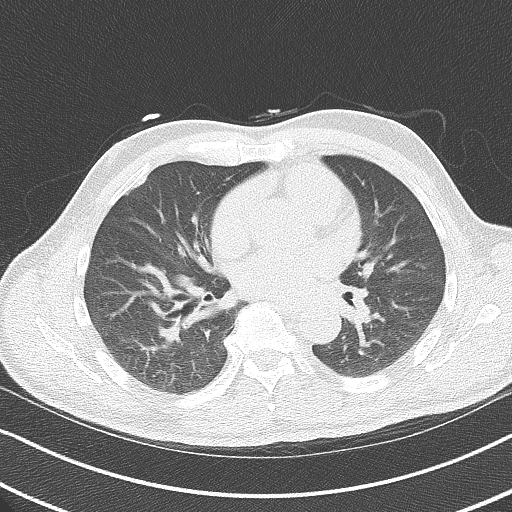

[Series 4: lung st 253 ms · axial · 0.68mm/px · z∈[-218,-149]mm · 5 of 35 slices shown]
[im 6/35  lung]
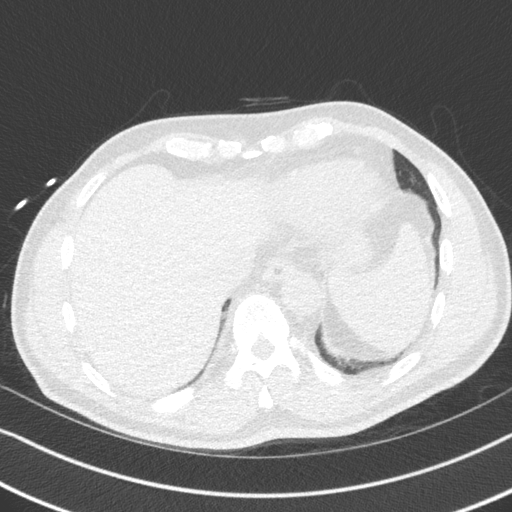
[im 12/35  lung]
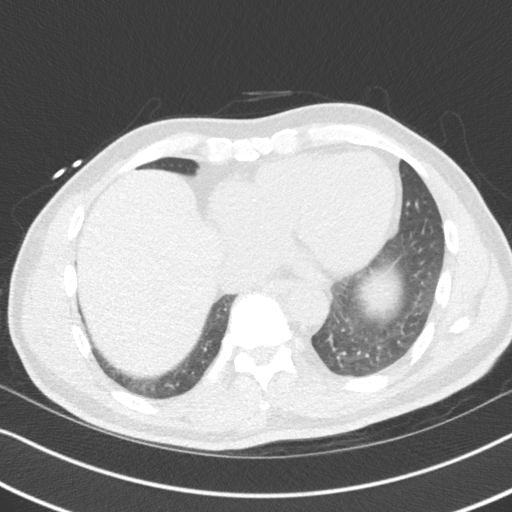
[im 18/35  lung]
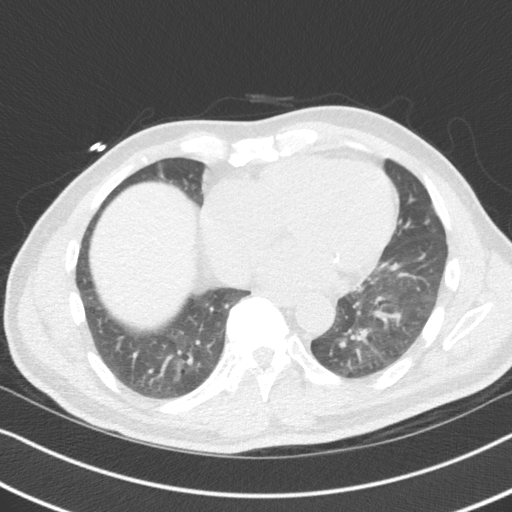
[im 23/35  lung]
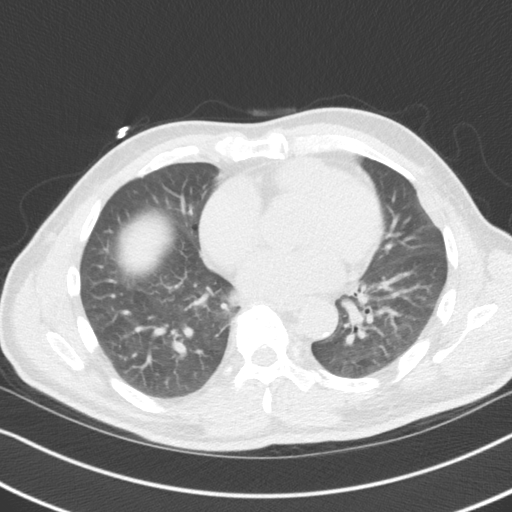
[im 29/35  lung]
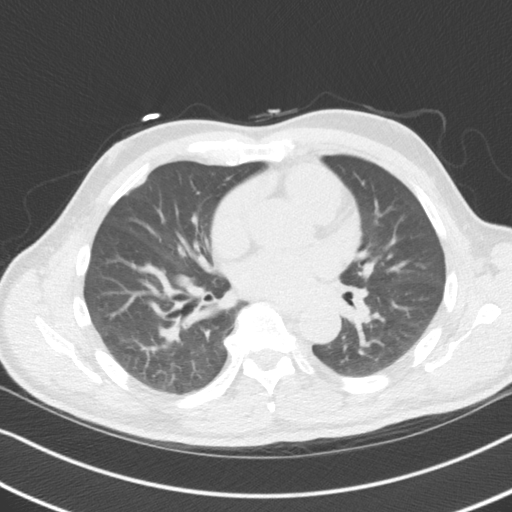

[14 of 20 positions shown; findings below may reference images not displayed]

FINDINGS: Atherosclerotic calcifications in the thoracic aorta. Noncalcified
pleural plaques are noted bilaterally. Within the visualized
portions of the thorax there are no suspicious appearing pulmonary
nodules or masses, there is no acute consolidative airspace disease,
no pleural effusions, no pneumothorax and no lymphadenopathy.
Visualized portions of the upper abdomen are unremarkable. There are
no aggressive appearing lytic or blastic lesions noted in the
visualized portions of the skeleton.
IMPRESSION: 1.  Aortic Atherosclerosis (HD2AM-OF3.3).
2. Noncalcified pleural plaques in the thorax bilaterally.
FINDINGS: Coronary arteries: Normal origins.

Coronary Calcium Score:

Left main: 0

Left anterior descending artery: 0

Left circumflex artery: 0

Right coronary artery: 0

Total: 0

Pericardium: Normal.

Ascending Aorta: Normal caliber.

Non-cardiac: See separate report from [REDACTED].
IMPRESSION: Coronary calcium score of 0 Agatston units. This suggests low risk
for future cardiac events.



If CAC=0, it is reasonable to withhold statin therapy and reassess
in 5 to 10 years, as long as higher risk conditions are absent
(diabetes mellitus, family history of premature CHD in first degree
relatives (males <55 years; females <65 years), cigarette smoking,
or LDL >=190 mg/dL).

If CAC is 1 to 99, it is reasonable to initiate statin therapy for
patients >=55 years of age.

If CAC is >=100 or >=75th percentile, it is reasonable to initiate
statin therapy at any age.

Cardiology referral should be considered for patients with CAC
scores >=400 or >=75th percentile.

*1857 AHA/ACC/AACVPR/AAPA/ABC/CANDE/ALIE/PENDOLIN/[place]/CHID/THOKALA/PRINCIP
Guideline on the Management of Blood Cholesterol: A Report of the
American College of Cardiology/American Heart Association Task Force
on Clinical Practice Guidelines. J Am Coll Cardiol.
8311;73(24):4546-4642.

*** End of Addendum ***
EXAM:
OVER-READ INTERPRETATION  CT CHEST

The following report is an over-read performed by radiologist Dr.
Paulus N Ceejay [REDACTED] on 09/13/2020. This
over-read does not include interpretation of cardiac or coronary
anatomy or pathology. The coronary calcium score interpretation by
the cardiologist is attached.
FINDINGS: Atherosclerotic calcifications in the thoracic aorta. Noncalcified
pleural plaques are noted bilaterally. Within the visualized
portions of the thorax there are no suspicious appearing pulmonary
nodules or masses, there is no acute consolidative airspace disease,
no pleural effusions, no pneumothorax and no lymphadenopathy.
Visualized portions of the upper abdomen are unremarkable. There are
no aggressive appearing lytic or blastic lesions noted in the
visualized portions of the skeleton.
IMPRESSION: 1.  Aortic Atherosclerosis (HD2AM-OF3.3).
2. Noncalcified pleural plaques in the thorax bilaterally.

## 2021-10-22 ENCOUNTER — Ambulatory Visit: Payer: Federal, State, Local not specified - PPO | Attending: Internal Medicine | Admitting: Internal Medicine

## 2021-10-22 ENCOUNTER — Encounter: Payer: Self-pay | Admitting: Internal Medicine

## 2021-10-22 VITALS — BP 143/75 | HR 69 | Temp 98.1°F | Ht 68.0 in | Wt 176.6 lb

## 2021-10-22 DIAGNOSIS — I700 Atherosclerosis of aorta: Secondary | ICD-10-CM

## 2021-10-22 DIAGNOSIS — I10 Essential (primary) hypertension: Secondary | ICD-10-CM | POA: Diagnosis not present

## 2021-10-22 DIAGNOSIS — Z0000 Encounter for general adult medical examination without abnormal findings: Secondary | ICD-10-CM | POA: Diagnosis not present

## 2021-10-22 DIAGNOSIS — Z2821 Immunization not carried out because of patient refusal: Secondary | ICD-10-CM

## 2021-10-22 DIAGNOSIS — L729 Follicular cyst of the skin and subcutaneous tissue, unspecified: Secondary | ICD-10-CM

## 2021-10-22 DIAGNOSIS — R972 Elevated prostate specific antigen [PSA]: Secondary | ICD-10-CM

## 2021-10-22 DIAGNOSIS — H409 Unspecified glaucoma: Secondary | ICD-10-CM

## 2021-10-22 DIAGNOSIS — E663 Overweight: Secondary | ICD-10-CM

## 2021-10-22 NOTE — Patient Instructions (Signed)
Health Maintenance After Age 70 After age 70, you are at a higher risk for certain long-term diseases and infections as well as injuries from falls. Falls are a major cause of broken bones and head injuries in people who are older than age 70. Getting regular preventive care can help to keep you healthy and well. Preventive care includes getting regular testing and making lifestyle changes as recommended by your health care provider. Talk with your health care provider about: Which screenings and tests you should have. A screening is a test that checks for a disease when you have no symptoms. A diet and exercise plan that is right for you. What should I know about screenings and tests to prevent falls? Screening and testing are the best ways to find a health problem early. Early diagnosis and treatment give you the best chance of managing medical conditions that are common after age 70. Certain conditions and lifestyle choices may make you more likely to have a fall. Your health care provider may recommend: Regular vision checks. Poor vision and conditions such as cataracts can make you more likely to have a fall. If you wear glasses, make sure to get your prescription updated if your vision changes. Medicine review. Work with your health care provider to regularly review all of the medicines you are taking, including over-the-counter medicines. Ask your health care provider about any side effects that may make you more likely to have a fall. Tell your health care provider if any medicines that you take make you feel dizzy or sleepy. Strength and balance checks. Your health care provider may recommend certain tests to check your strength and balance while standing, walking, or changing positions. Foot health exam. Foot pain and numbness, as well as not wearing proper footwear, can make you more likely to have a fall. Screenings, including: Osteoporosis screening. Osteoporosis is a condition that causes  the bones to get weaker and break more easily. Blood pressure screening. Blood pressure changes and medicines to control blood pressure can make you feel dizzy. Depression screening. You may be more likely to have a fall if you have a fear of falling, feel depressed, or feel unable to do activities that you used to do. Alcohol use screening. Using too much alcohol can affect your balance and may make you more likely to have a fall. Follow these instructions at home: Lifestyle Do not drink alcohol if: Your health care provider tells you not to drink. If you drink alcohol: Limit how much you have to: 0-1 drink a day for women. 0-2 drinks a day for men. Know how much alcohol is in your drink. In the [place] one drink equals one 12 oz bottle of beer (355 mL), one 5 oz glass of wine (148 mL), or one 1 oz glass of hard liquor (44 mL). Do not use any products that contain nicotine or tobacco. These products include cigarettes, chewing tobacco, and vaping devices, such as e-cigarettes. If you need help quitting, ask your health care provider. Activity  Follow a regular exercise program to stay fit. This will help you maintain your balance. Ask your health care provider what types of exercise are appropriate for you. If you need a cane or walker, use it as recommended by your health care provider. Wear supportive shoes that have nonskid soles. Safety  Remove any tripping hazards, such as rugs, cords, and clutter. Install safety equipment such as grab bars in bathrooms and safety rails on stairs. Keep rooms and walkways   well-lit. General instructions Talk with your health care provider about your risks for falling. Tell your health care provider if: You fall. Be sure to tell your health care provider about all falls, even ones that seem minor. You feel dizzy, tiredness (fatigue), or off-balance. Take over-the-counter and prescription medicines only as told by your health care provider. These include  supplements. Eat a healthy diet and maintain a healthy weight. A healthy diet includes low-fat dairy products, low-fat (lean) meats, and fiber from whole grains, beans, and lots of fruits and vegetables. Stay current with your vaccines. Schedule regular health, dental, and eye exams. Summary Having a healthy lifestyle and getting preventive care can help to protect your health and wellness after age 70. Screening and testing are the best way to find a health problem early and help you avoid having a fall. Early diagnosis and treatment give you the best chance for managing medical conditions that are more common for people who are older than age 70. Falls are a major cause of broken bones and head injuries in people who are older than age 70. Take precautions to prevent a fall at home. Work with your health care provider to learn what changes you can make to improve your health and wellness and to prevent falls. This information is not intended to replace advice given to you by your health care provider. Make sure you discuss any questions you have with your health care provider. Document Revised: 08/06/2020 Document Reviewed: 08/06/2020 Elsevier Patient Education  2023 Elsevier Inc.  

## 2021-10-22 NOTE — Progress Notes (Signed)
Patient ID: Leonard Bryant, male    DOB: 1950/08/09  MRN: [id number]  CC: Annual exam  Subjective: Leonard Bryant is a 71 y.o. male who presents for physical His concerns today include:  Pt with hx of HTN, glaucoma, BPH, mild aortic atherosclerosis  HTN: checks BP about 10x in past 2 mths Reports range of 120 or less for SBP and less than 80 for DBP. Took Norvasc 5 mg already today Limits salt No CP/SOB/LE edema or HA  Hx of mild elev PSA.  Dx with BPH in past Does not have to strain to get flow going.  Reports stop and go flow.  Wakes once at nights to urinate  He did see 2nd ophthalmologist (Dr. Idolina Primer) last yr and dx of glaucoma confirmed.  Got new rxn bifocals last yr.   No problems seeing  Saw ENT.  Told he has some hearing loss BL.  Told if it does not bother him, he does not have to do anything at this time. Pt reports it is not causing any significant issue for him at this time  HM:  Declines PCV 23, Shingrix.     Patient Active Problem List   Diagnosis Date Noted   Glaucoma 01/01/2021   Elevated PSA 01/01/2021   Decreased hearing of both ears 01/01/2021   Premature atrial contractions 10/01/2020   Aortic atherosclerosis ([place]) 10/01/2020   PVC (premature ventricular contraction) 06/28/2020   Floaters in visual field, right 06/28/2020   History of COVID-19 04/13/2020   Essential hypertension 07/06/2019   Refusal of blood transfusions as patient is Jehovah's Witness 05/23/2014   BPH (benign prostatic hyperplasia) 03/15/2014   Erectile dysfunction 03/15/2014   Leukopenia 05/06/2012   Allergic rhinitis 05/06/2012   Hyperlipidemia      Current Outpatient Medications on File Prior to Visit  Medication Sig Dispense Refill   amLODipine (NORVASC) 5 MG tablet TAKE 1 TABLET (5 MG TOTAL) BY MOUTH DAILY. 90 tablet 0   aspirin EC 81 MG tablet Take 81 mg by mouth daily.     fluticasone (FLONASE) 50 MCG/ACT nasal spray Place into both nostrils daily.     Multiple Vitamin  (MULTIVITAMIN) tablet Take 1 tablet by mouth daily.     Travoprost, BAK Free, (TRAVATAN) 0.004 % SOLN ophthalmic solution INSTILL 1 DROP INTO EACH EYE ONCE DAILY AT NIGHT     No current facility-administered medications on file prior to visit.    Allergies  Allergen Reactions   Pseudoephedrine Other (See Comments)    Testicular pain and tenderness   Penicillins Rash    Has patient had a PCN reaction causing immediate rash, facial/tongue/throat swelling, SOB or lightheadedness with hypotension: No Has patient had a PCN reaction causing severe rash involving mucus membranes or skin necrosis: No Has patient had a PCN reaction that required hospitalization No Has patient had a PCN reaction occurring within the last 10 years: No If all of the above answers are "NO", then may proceed with Cephalosporin use.     Social History   Socioeconomic History   Marital status: Married    Spouse name: Not on file   Number of children: Not on file   Years of education: Not on file   Highest education level: Not on file  Occupational History   Not on file  Tobacco Use   Smoking status: Former    Packs/day: 1.50    Years: 5.00    Total pack years: 7.50    Types: Cigarettes  Smokeless tobacco: Never  Vaping Use   Vaping Use: Never used  Substance and Sexual Activity   Alcohol use: Yes    Comment: glass of wine or beer a day   Drug use: No   Sexual activity: Not on file  Other Topics Concern   Not on file  Social History Narrative   Lives wife wife.  Grown children.  Retired post Therapist, nutritional.    Social Determinants of Health   Financial Resource Strain: Not on file  Food Insecurity: Not on file  Transportation Needs: Not on file  Physical Activity: Not on file  Stress: Not on file  Social Connections: Not on file  Intimate Partner Violence: Not on file    Family History  Problem Relation Age of Onset   Diabetes Mother    Heart disease Mother    Stroke  Mother    Clotting disorder Sister        Daughter also with clotting disorder.   Colon cancer Neg Hx    Esophageal cancer Neg Hx    Rectal cancer Neg Hx    Stomach cancer Neg Hx     Past Surgical History:  Procedure Laterality Date   COLONOSCOPY     15 years ago   HERNIA REPAIR     Inguinal     ROS: Review of Systems  Constitutional:  Negative for fatigue.       Reports of doing stretching exercises every morning.  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  HENT:  Negative for sore throat.        He has had some nasal congestion intermittently over the past 2 weeks.  Uses Flonase as needed with good results.  Respiratory:  Negative for cough and shortness of breath.   Cardiovascular:  Negative for chest pain.       He has aortic atherosclerosis as was seen on cardiac CT done last year summer.  It was prescribed Crestor by PA but patient never took it.  He saw his cardiologist Dr. Percival Spanish and was told that he did not need to take it since the degree of atherosclerosis was mild and pt did not want to.  Gastrointestinal:  Negative for blood in stool.       Bowel movements are regular.  Genitourinary:  Negative for hematuria.  Musculoskeletal:  Negative for arthralgias.  Skin:        Has a mass on his upper back that became painful and swollen a few weeks ago.  He applied peroxide to it.  It never came to ahead.  He was not able to see it fully because it was on the posterior thorax right side.  He feels that the swelling has subsided but he would like for me to take a look at it.     PHYSICAL EXAM: BP (!) 143/75   Pulse 69   Temp 98.1 F (36.7 C) (Oral)   Ht '5\' 8"'$  (1.727 m)   Wt 176 lb 9.6 oz (80.1 kg)   SpO2 98%   BMI 26.85 kg/m   Wt Readings from Last 3 Encounters:  10/22/21 176 lb 9.6 oz (80.1 kg)  06/01/21 181 lb 10.5 oz (82.4 kg)  01/01/21 181 lb 9.6 oz (82.4 kg)  BP 140/90  Physical Exam  General appearance - alert,  well appearing, older African-American male and in no distress Mental status - normal mood, behavior, speech, dress, motor activity, and thought processes Eyes -  pupils equal and reactive, extraocular eye movements intact Ears - bilateral TM's and external ear canals normal Nose - normal and patent, no erythema, discharge or polyps Mouth - mucous membranes moist, pharynx normal without lesions Neck - supple, no significant adenopathy Lymphatics - no palpable lymphadenopathy, no hepatosplenomegaly Chest - clear to auscultation, no wheezes, rales or rhonchi, symmetric air entry Heart - normal rate, regular rhythm, normal S1, S2, no murmurs, rubs, clicks or gallops Abdomen - soft, nontender, nondistended, no masses or organomegaly Musculoskeletal - no joint tenderness, deformity or swelling Extremities - peripheral pulses normal, no pedal edema, no clubbing or cyanosis Skin -patient has 2 cm slightly raised area in the right upper posterior thorax on the medial border of the lower shoulder blade.  There is mild surrounding erythema.  There is induration.  No fluctuance.      Latest Ref Rng & Units 06/01/2021   12:34 PM 06/27/2020    3:04 PM 12/30/2018   10:44 AM  CMP  Glucose 70 - 99 mg/dL 57  90  96   BUN 8 - 23 mg/dL '15  15  14   '$ Creatinine 0.61 - 1.24 mg/dL 0.58  0.81  1.04   Sodium 135 - 145 mmol/L 139  140  139   Potassium 3.5 - 5.1 mmol/L 4.1  4.1  4.9   Chloride 98 - 111 mmol/L 107  105  104   CO2 22 - 32 mmol/L 26   26   Calcium 8.9 - 10.3 mg/dL 10.4  9.8  10.6   Total Protein 6.0 - 8.5 g/dL  6.7  6.8   Total Bilirubin 0.0 - 1.2 mg/dL  0.4  0.7   Alkaline Phos 44 - 121 IU/L  57  62   AST 0 - 40 IU/L  35  21   ALT 0 - 44 IU/L   19    Lipid Panel     Component Value Date/Time   CHOL 155 06/27/2020 1504   TRIG 45 06/27/2020 1504   HDL 49 06/27/2020 1504   CHOLHDL 3.2 06/27/2020 1504   CHOLHDL 3.3 03/15/2014 1113   VLDL 15 03/15/2014 1113   LDLCALC 96 06/27/2020 1504    LDLDIRECT 106 (H) 05/05/2012 1032    CBC    Component Value Date/Time   WBC 6.8 06/01/2021 1234   RBC 4.98 06/01/2021 1234   HGB 14.1 06/01/2021 1234   HGB 13.3 06/27/2020 1504   HCT 42.6 06/01/2021 1234   HCT 39.5 06/27/2020 1504   PLT 187 06/01/2021 1234   PLT 240 06/27/2020 1504   MCV 85.5 06/01/2021 1234   MCV 82 06/27/2020 1504   MCH 28.3 06/01/2021 1234   MCHC 33.1 06/01/2021 1234   RDW 14.1 06/01/2021 1234   RDW 13.7 06/27/2020 1504   LYMPHSABS 1.1 06/01/2021 1234   LYMPHSABS 1.0 06/27/2020 1504   MONOABS 0.5 06/01/2021 1234   EOSABS 0.1 06/01/2021 1234   EOSABS 0.1 06/27/2020 1504   BASOSABS 0.0 06/01/2021 1234   BASOSABS 0.0 06/27/2020 1504    ASSESSMENT AND PLAN:  1. Annual physical exam   2. Essential hypertension Not at goal in office but pt reports good readings at home. Continue current dose of Norvasc 5 mg daily.  Advised to continue monitoring blood pressure at home with goal being 130/80 or lower. - Comprehensive metabolic panel  3. Elevated PSA He is agreeable to having the PSA rechecked today. - PSA  4. Cyst of skin It looks like he had an  infected cyst that got better.  I recommend referral to dermatology to have this removed completely - Ambulatory referral to Dermatology  5. Glaucoma, unspecified glaucoma type, unspecified laterality Followed by ophthalmology.  No significant vision impairment at this time.  6. Aortic atherosclerosis ([place]) I told patient that ideally we do sometimes put patients on statin therapy given this diagnosis.  However if the cardiologist thinks that he does not need to be on it, we will hold off. -lipid profile  7. Over weight Patient advised to eliminate sugary drinks from the diet, cut back on portion sizes especially of white carbohydrates, eat more white lean meat like chicken [place] and seafood instead of beef or pork and incorporate fresh fruits and vegetables into the diet daily. Encouraged him to continue  regular exercise - Hemoglobin A1c  8. Pneumococcal vaccination declined Recommended.  Patient declined.  He also declined Shingrix vaccine. He did have his COVID-19 vaccine card with him.  He received the [place] 5 valent vaccine booster 12/05/2021   Patient was given the opportunity to ask questions.  Patient verbalized understanding of the plan and was able to repeat key elements of the plan.   This documentation was completed using Radio producer.  Any transcriptional errors are unintentional.  Orders Placed This Encounter  Procedures   PSA   Hemoglobin A1c   Comprehensive metabolic panel   Lipid panel   Ambulatory referral to Dermatology     Requested Prescriptions    No prescriptions requested or ordered in this encounter    Return in about 6 months (around 04/24/2022).  Karle Plumber, MD, FACP

## 2021-10-23 ENCOUNTER — Telehealth: Payer: Self-pay | Admitting: Internal Medicine

## 2021-10-23 LAB — PSA: Prostate Specific Ag, Serum: 4.4 ng/mL — ABNORMAL HIGH (ref 0.0–4.0)

## 2021-10-23 LAB — COMPREHENSIVE METABOLIC PANEL
ALT: 13 IU/L (ref 0–44)
AST: 17 IU/L (ref 0–40)
Albumin/Globulin Ratio: 1.9 (ref 1.2–2.2)
Albumin: 4.4 g/dL (ref 3.9–4.9)
Alkaline Phosphatase: 54 IU/L (ref 44–121)
BUN/Creatinine Ratio: 15 (ref 10–24)
BUN: 13 mg/dL (ref 8–27)
Bilirubin Total: 0.6 mg/dL (ref 0.0–1.2)
CO2: 23 mmol/L (ref 20–29)
Calcium: 10.2 mg/dL (ref 8.6–10.2)
Chloride: 106 mmol/L (ref 96–106)
Creatinine, Ser: 0.86 mg/dL (ref 0.76–1.27)
Globulin, Total: 2.3 g/dL (ref 1.5–4.5)
Glucose: 87 mg/dL (ref 70–99)
Potassium: 4.5 mmol/L (ref 3.5–5.2)
Sodium: 140 mmol/L (ref 134–144)
Total Protein: 6.7 g/dL (ref 6.0–8.5)
eGFR: 94 mL/min/{1.73_m2} (ref >59)

## 2021-10-23 LAB — HEPATIC FUNCTION PANEL: Bilirubin, Direct: 0.16 mg/dL (ref 0.00–0.40)

## 2021-10-23 LAB — HEMOGLOBIN A1C
Est. average glucose Bld gHb Est-mCnc: 105 mg/dL
Hgb A1c MFr Bld: 5.3 % (ref 4.8–5.6)

## 2021-10-23 NOTE — Telephone Encounter (Signed)
Opened in error

## 2021-10-24 LAB — LIPID PANEL
Chol/HDL Ratio: 3.4 ratio (ref 0.0–5.0)
Cholesterol, Total: 161 mg/dL (ref 100–199)
HDL: 47 mg/dL (ref >39)
LDL Chol Calc (NIH): 102 mg/dL — ABNORMAL HIGH (ref 0–99)
Triglycerides: 57 mg/dL (ref 0–149)
VLDL Cholesterol Cal: 12 mg/dL (ref 5–40)

## 2021-10-24 LAB — SPECIMEN STATUS REPORT

## 2021-10-30 ENCOUNTER — Ambulatory Visit: Payer: Federal, State, Local not specified - PPO | Admitting: Physician Assistant

## 2021-12-17 ENCOUNTER — Other Ambulatory Visit: Payer: Self-pay | Admitting: Internal Medicine

## 2021-12-18 ENCOUNTER — Other Ambulatory Visit: Payer: Self-pay

## 2021-12-18 MED ORDER — FLUTICASONE PROPIONATE 50 MCG/ACT NA SUSP
1.0000 | Freq: Every day | NASAL | 1 refills | Status: DC
  Filled 2021-12-18: qty 16, 30d supply, fill #0

## 2021-12-20 ENCOUNTER — Other Ambulatory Visit: Payer: Self-pay

## 2021-12-22 ENCOUNTER — Other Ambulatory Visit: Payer: Self-pay | Admitting: Internal Medicine

## 2021-12-22 DIAGNOSIS — I10 Essential (primary) hypertension: Secondary | ICD-10-CM

## 2021-12-23 NOTE — Telephone Encounter (Signed)
Requested Prescriptions  Pending Prescriptions Disp Refills  . amLODipine (NORVASC) 5 MG tablet [Pharmacy Med Name: AMLODIPINE BESYLATE 5 MG TAB] 90 tablet 1    Sig: TAKE 1 TABLET (5 MG TOTAL) BY MOUTH DAILY.     Cardiovascular: Calcium Channel Blockers 2 Failed - 12/22/2021  9:23 AM      Failed - Last BP in normal range    BP Readings from Last 1 Encounters:  10/22/21 (!) 143/75         Passed - Last Heart Rate in normal range    Pulse Readings from Last 1 Encounters:  10/22/21 69         Passed - Valid encounter within last 6 months    Recent Outpatient Visits          2 months ago Annual physical exam   [place] Ladell Pier, MD   10 months ago Essential hypertension   [place], RPH-CPP   11 months ago Essential hypertension   [place], Jarome Matin, RPH-CPP   11 months ago Essential hypertension   [place], MD   1 year ago Seasonal allergic rhinitis due to other allergic trigger   [facility], Loraine Grip, PA-C      Future Appointments            In 4 months Wynetta Emery Dalbert Batman, MD [place]

## 2022-04-29 ENCOUNTER — Encounter: Payer: Self-pay | Admitting: Internal Medicine

## 2022-04-29 ENCOUNTER — Ambulatory Visit
Admission: RE | Admit: 2022-04-29 | Discharge: 2022-04-29 | Disposition: A | Discharge status: Home / Self Care | Payer: Federal, State, Local not specified - PPO | Source: Ambulatory Visit | Attending: Internal Medicine | Admitting: Internal Medicine

## 2022-04-29 ENCOUNTER — Ambulatory Visit: Payer: Federal, State, Local not specified - PPO | Attending: Internal Medicine | Admitting: Internal Medicine

## 2022-04-29 VITALS — BP 134/76 | HR 77 | Temp 98.8°F | Ht 68.0 in | Wt 184.0 lb

## 2022-04-29 DIAGNOSIS — R051 Acute cough: Secondary | ICD-10-CM | POA: Diagnosis not present

## 2022-04-29 DIAGNOSIS — E785 Hyperlipidemia, unspecified: Secondary | ICD-10-CM

## 2022-04-29 DIAGNOSIS — D649 Anemia, unspecified: Secondary | ICD-10-CM

## 2022-04-29 DIAGNOSIS — I10 Essential (primary) hypertension: Secondary | ICD-10-CM | POA: Diagnosis not present

## 2022-04-29 DIAGNOSIS — I700 Atherosclerosis of aorta: Secondary | ICD-10-CM

## 2022-04-29 DIAGNOSIS — R972 Elevated prostate specific antigen [PSA]: Secondary | ICD-10-CM

## 2022-04-29 DIAGNOSIS — J069 Acute upper respiratory infection, unspecified: Secondary | ICD-10-CM

## 2022-04-29 MED ORDER — AMLODIPINE BESYLATE 10 MG PO TABS: 10.0000 mg | ORAL_TABLET | Freq: Every day | ORAL | 1 refills | Status: DC

## 2022-04-29 MED ORDER — BENZONATATE 100 MG PO CAPS: 100.0000 mg | ORAL_CAPSULE | Freq: Two times a day (BID) | ORAL | 0 refills | Status: DC | PRN

## 2022-04-29 NOTE — Patient Instructions (Addendum)
Increase Amlodipine to 10 mg daily. Please stop in radiology down stairs to get x-ray of chest done today.

## 2022-04-29 NOTE — Progress Notes (Addendum)
Patient ID: TAE VONADA, male    DOB: June 09, 1950  MRN: [id number]  CC: Hypertension (HTN f/u./Dry cough X3 days, sore back & chest muscles due to back pain. /No to flu vax.)   Subjective: Leonard Bryant is a 72 y.o. male who presents for chronic ds management His concerns today include:  Pt with hx of HTN, glaucoma, BPH, mild aortic atherosclerosis declines statin, dec hearing  Pt c/o dry uncontrollable cough that started 3 days ago Caused soreness in chest and back muscles Used OTC Mucinex 12 hrs.  Made the cough worse.  Then took Mucinex DM which helped suppressed the cough.  Was able to get up a little mucous this a.m Has had 2 negative home COVID test Feels better but this a.m but had temp of 100.  Took Ibuprofen No SOB or chest congestion; slight sinus congestion for which he used Flonase and Netti pot.  No sore throat. Gets tickle in throat and causes him to cough.  Has not noticed feeling of drainage at back of throat.  Sick contacts: saw aunt in hosp 2 days before symptoms developed.   Has not had flu shot and does not want one today.  Has not had updated COVID booster  HTN:  taking the Amlodipine.  Does not check BP much HL/aortic atherosclerosis:  LDL was 101 with ASCVD score 21%.  Recommended statin.  Wants to see Dr. Percival Spanish first to discuss  Elev PSA: Recheck PSA on last visit.  It was 4.4 up from 4.1.  I recommended that he touch base with his urologist but he has not done so as yet.  Requests referral today.  Passing urine okay.   Patient Active Problem List   Diagnosis Date Noted   Glaucoma 01/01/2021   Elevated PSA 01/01/2021   Decreased hearing of both ears 01/01/2021   Premature atrial contractions 10/01/2020   Aortic atherosclerosis ([place]) 10/01/2020   PVC (premature ventricular contraction) 06/28/2020   Floaters in visual field, right 06/28/2020   History of COVID-19 04/13/2020   Essential hypertension 07/06/2019   Refusal of blood transfusions as patient is  Jehovah's Witness 05/23/2014   BPH (benign prostatic hyperplasia) 03/15/2014   Erectile dysfunction 03/15/2014   Leukopenia 05/06/2012   Allergic rhinitis 05/06/2012   Hyperlipidemia      Current Outpatient Medications on File Prior to Visit  Medication Sig Dispense Refill   aspirin EC 81 MG tablet Take 81 mg by mouth daily.     fluticasone (FLONASE) 50 MCG/ACT nasal spray Place 1 spray into both nostrils daily. 16 g 1   Multiple Vitamin (MULTIVITAMIN) tablet Take 1 tablet by mouth daily.     Travoprost, BAK Free, (TRAVATAN) 0.004 % SOLN ophthalmic solution INSTILL 1 DROP INTO EACH EYE ONCE DAILY AT NIGHT     No current facility-administered medications on file prior to visit.    Allergies  Allergen Reactions   Pseudoephedrine Other (See Comments)    Testicular pain and tenderness   Penicillins Rash    Has patient had a PCN reaction causing immediate rash, facial/tongue/throat swelling, SOB or lightheadedness with hypotension: No Has patient had a PCN reaction causing severe rash involving mucus membranes or skin necrosis: No Has patient had a PCN reaction that required hospitalization No Has patient had a PCN reaction occurring within the last 10 years: No If all of the above answers are "NO", then may proceed with Cephalosporin use.     Social History   Socioeconomic History   Marital  status: Married    Spouse name: Not on file   Number of children: Not on file   Years of education: Not on file   Highest education level: Not on file  Occupational History   Not on file  Tobacco Use   Smoking status: Former    Packs/day: 1.50    Years: 5.00    Total pack years: 7.50    Types: Cigarettes   Smokeless tobacco: Never  Vaping Use   Vaping Use: Never used  Substance and Sexual Activity   Alcohol use: Yes    Comment: glass of wine or beer a day   Drug use: No   Sexual activity: Not on file  Other Topics Concern   Not on file  Social History Narrative   Lives wife  wife.  Grown children.  Retired post Therapist, nutritional.    Social Determinants of Health   Financial Resource Strain: Not on file  Food Insecurity: Not on file  Transportation Needs: Not on file  Physical Activity: Not on file  Stress: Not on file  Social Connections: Not on file  Intimate Partner Violence: Not on file    Family History  Problem Relation Age of Onset   Diabetes Mother    Heart disease Mother    Stroke Mother    Clotting disorder Sister        Daughter also with clotting disorder.   Colon cancer Neg Hx    Esophageal cancer Neg Hx    Rectal cancer Neg Hx    Stomach cancer Neg Hx     Past Surgical History:  Procedure Laterality Date   COLONOSCOPY     15 years ago   HERNIA REPAIR     Inguinal     ROS: Review of Systems Negative except as stated above  PHYSICAL EXAM: BP 134/76 (BP Location: Left Arm, Patient Position: Sitting, Cuff Size: Normal)   Pulse 77   Temp 98.8 F (37.1 C) (Oral)   Ht '5\' 8"'$  (1.727 m)   Wt 184 lb (83.5 kg)   SpO2 99%   BMI 27.98 kg/m   Physical Exam BP 136/82  General appearance - alert, well appearing, older AAM and in no distress.  Patient does not appear toxic.  He has no audible congestion. Mental status - normal mood, behavior, speech, dress, motor activity, and thought processes Nose - normal and patent, no erythema, discharge or polyps Mouth - mucous membranes moist, pharynx normal without lesions.  No drainage noted at the back of the throat. Neck - supple, no significant adenopathy Chest -slight popping heard in the left lower base.  Other lung fields are clear Heart - normal rate, regular rhythm, normal S1, S2, no murmurs, rubs, clicks or gallops Extremities - peripheral pulses normal, no pedal edema, no clubbing or cyanosis     Latest Ref Rng & Units 10/22/2021   11:29 AM 06/01/2021   12:34 PM 06/27/2020    3:04 PM  CMP  Glucose 70 - 99 mg/dL 87  57  90   BUN 8 - 27 mg/dL '13  15  15   '$ Creatinine  0.76 - 1.27 mg/dL 0.86  0.58  0.81   Sodium 134 - 144 mmol/L 140  139  140   Potassium 3.5 - 5.2 mmol/L 4.5  4.1  4.1   Chloride 96 - 106 mmol/L 106  107  105   CO2 20 - 29 mmol/L 23  26    Calcium 8.6 - 10.2 mg/dL  10.2  10.4  9.8   Total Protein 6.0 - 8.5 g/dL 6.7   6.7   Total Bilirubin 0.0 - 1.2 mg/dL 0.6   0.4   Alkaline Phos 44 - 121 IU/L 54   57   AST 0 - 40 IU/L 17   35   ALT 0 - 44 IU/L 13      Lipid Panel     Component Value Date/Time   CHOL 161 10/22/2021 1129   TRIG 57 10/22/2021 1129   HDL 47 10/22/2021 1129   CHOLHDL 3.4 10/22/2021 1129   CHOLHDL 3.3 03/15/2014 1113   VLDL 15 03/15/2014 1113   LDLCALC 102 (H) 10/22/2021 1129   LDLDIRECT 106 (H) 05/05/2012 1032    CBC    Component Value Date/Time   WBC 6.8 06/01/2021 1234   RBC 4.98 06/01/2021 1234   HGB 14.1 06/01/2021 1234   HGB 13.3 06/27/2020 1504   HCT 42.6 06/01/2021 1234   HCT 39.5 06/27/2020 1504   PLT 187 06/01/2021 1234   PLT 240 06/27/2020 1504   MCV 85.5 06/01/2021 1234   MCV 82 06/27/2020 1504   MCH 28.3 06/01/2021 1234   MCHC 33.1 06/01/2021 1234   RDW 14.1 06/01/2021 1234   RDW 13.7 06/27/2020 1504   LYMPHSABS 1.1 06/01/2021 1234   LYMPHSABS 1.0 06/27/2020 1504   MONOABS 0.5 06/01/2021 1234   EOSABS 0.1 06/01/2021 1234   EOSABS 0.1 06/27/2020 1504   BASOSABS 0.0 06/01/2021 1234   BASOSABS 0.0 06/27/2020 1504    ASSESSMENT AND PLAN: 1. Viral upper respiratory tract infection 2. Acute cough -I suspect this is an acute viral illness with or without postnasal drip.  I did hear some fine popping at the left lower base so we will get a chest x-ray.  We will screen for COVID flu and RSV but I doubt flu as patient is nontoxic-appearing Encourage patient to consider getting his flu vaccine and an updated COVID booster. - benzonatate (TESSALON) 100 MG capsule; Take 1 capsule (100 mg total) by mouth 2 (two) times daily as needed for cough.  Dispense: 20 capsule; Refill: 0 - COVID-19, Flu A+B  and RSV - CBC With Diff/Platelet - DG Chest 2 View; Future  3. Essential hypertension Close to goal.  Increase amlodipine to 10 mg daily. - amLODipine (NORVASC) 10 MG tablet; Take 1 tablet (10 mg total) by mouth daily.  Dispense: 90 tablet; Refill: 1  4. Aortic atherosclerosis ([place]) 5. Hyperlipidemia, unspecified hyperlipidemia type Recommend statin therapy.  Patient would like to discuss with his cardiologist first. - Ambulatory referral to Cardiology  6. Elevated PSA This is mild and most likely due to BPH and age.  However he will follow-up with his urologist. - Ambulatory referral to Urology  Addendum 04/30/2022: Patient with hemoglobin of 12.9.  Previous level was 14.1 11 months ago.  I will have the lab add iron studies.  Patient was given the opportunity to ask questions.  Patient verbalized understanding of the plan and was able to repeat key elements of the plan.   This documentation was completed using Radio producer.  Any transcriptional errors are unintentional.  Orders Placed This Encounter  Procedures   COVID-19, Flu A+B and RSV   DG Chest 2 View   CBC With Diff/Platelet   Ambulatory referral to Cardiology   Ambulatory referral to Urology     Requested Prescriptions   Signed Prescriptions Disp Refills   benzonatate (TESSALON) 100 MG capsule 20 capsule 0  Sig: Take 1 capsule (100 mg total) by mouth 2 (two) times daily as needed for cough.   amLODipine (NORVASC) 10 MG tablet 90 tablet 1    Sig: Take 1 tablet (10 mg total) by mouth daily.    Return in about 4 months (around 08/28/2022).  Karle Plumber, MD, FACP

## 2022-04-30 LAB — CBC WITH DIFF/PLATELET
Basophils Absolute: 0 10*3/uL (ref 0.0–0.2)
Basos: 1 %
EOS (ABSOLUTE): 0.1 10*3/uL (ref 0.0–0.4)
Eos: 3 %
Hematocrit: 38.2 % (ref 37.5–51.0)
Hemoglobin: 12.9 g/dL — ABNORMAL LOW (ref 13.0–17.7)
Immature Grans (Abs): 0 10*3/uL (ref 0.0–0.1)
Immature Granulocytes: 0 %
Lymphocytes Absolute: 0.7 10*3/uL (ref 0.7–3.1)
Lymphs: 19 %
MCH: 28.1 pg (ref 26.6–33.0)
MCHC: 33.8 g/dL (ref 31.5–35.7)
MCV: 83 fL (ref 79–97)
Monocytes Absolute: 0.4 10*3/uL (ref 0.1–0.9)
Monocytes: 11 %
Neutrophils Absolute: 2.7 10*3/uL (ref 1.4–7.0)
Neutrophils: 66 %
Platelets: 161 10*3/uL (ref 150–450)
RBC: 4.59 x10E6/uL (ref 4.14–5.80)
RDW: 13 % (ref 11.6–15.4)
WBC: 4 10*3/uL (ref 3.4–10.8)

## 2022-04-30 NOTE — Addendum Note (Signed)
Addended by: Karle Plumber B on: 04/30/2022 08:57 AM   Modules accepted: Orders

## 2022-05-01 LAB — COVID-19, FLU A+B AND RSV
Influenza A, NAA: NOT DETECTED
Influenza B, NAA: NOT DETECTED
RSV, NAA: NOT DETECTED
SARS-CoV-2, NAA: NOT DETECTED

## 2022-05-27 ENCOUNTER — Ambulatory Visit: Payer: Federal, State, Local not specified - PPO | Admitting: Cardiology

## 2022-06-03 DIAGNOSIS — R972 Elevated prostate specific antigen [PSA]: Secondary | ICD-10-CM | POA: Diagnosis not present

## 2022-06-03 DIAGNOSIS — R3915 Urgency of urination: Secondary | ICD-10-CM | POA: Diagnosis not present

## 2022-06-03 DIAGNOSIS — N5201 Erectile dysfunction due to arterial insufficiency: Secondary | ICD-10-CM | POA: Diagnosis not present

## 2022-06-17 ENCOUNTER — Other Ambulatory Visit: Payer: Self-pay | Admitting: Internal Medicine

## 2022-06-17 DIAGNOSIS — I10 Essential (primary) hypertension: Secondary | ICD-10-CM

## 2022-06-17 NOTE — Telephone Encounter (Signed)
Not current dosing for this medication Requested Prescriptions  Pending Prescriptions Disp Refills   amLODipine (NORVASC) 5 MG tablet [Pharmacy Med Name: AMLODIPINE BESYLATE 5 MG TAB] 90 tablet 1    Sig: TAKE 1 TABLET (5 MG TOTAL) BY MOUTH DAILY.     Cardiovascular: Calcium Channel Blockers 2 Passed - 06/17/2022  1:48 AM      Passed - Last BP in normal range    BP Readings from Last 1 Encounters:  04/29/22 134/76         Passed - Last Heart Rate in normal range    Pulse Readings from Last 1 Encounters:  04/29/22 77         Passed - Valid encounter within last 6 months    Recent Outpatient Visits           1 month ago Essential hypertension   [place], MD   7 months ago Annual physical exam   [place], MD   1 year ago Essential hypertension   [place], RPH-CPP   1 year ago Essential hypertension   [place], RPH-CPP   1 year ago Essential hypertension   [place], MD       Future Appointments             In 1 month Minus Breeding, MD [place] at [facility]   In 2 months Wynetta Emery, Dalbert Batman, MD [place]

## 2022-07-09 ENCOUNTER — Encounter: Payer: Self-pay | Admitting: Gastroenterology

## 2022-07-10 ENCOUNTER — Ambulatory Visit: Payer: Federal, State, Local not specified - PPO | Admitting: Cardiology

## 2022-07-23 DIAGNOSIS — R002 Palpitations: Secondary | ICD-10-CM | POA: Insufficient documentation

## 2022-07-23 NOTE — Progress Notes (Unsigned)
  Cardiology Office Note:   Date:  07/24/2022  ID:  Leonard Bryant, DOB [birth year], MRN [id number]  History of Present Illness:   Leonard Bryant is a 72 y.o. male who is presenting for follow up of ectopy.  Monitor demonstrated rare SVT brief runs of very isolated PVCs.  He had a calcium score which was 0.  He did have some aortic atherosclerosis.  I last saw him in 2022.  He had no new problems since I saw him.  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  He has 13 steps in his home. The patient denies any new symptoms such as chest discomfort, neck or arm discomfort. There has been no new shortness of breath, PND or orthopnea. There have been no reported palpitations, presyncope or syncope.  His blood pressure has been slightly elevated and he recently had his dose of amlodipine increased.  His primary also wanted to treat him with statin.  He wanted to try to avoid this.  ROS: As stated in the HPI and negative for all other systems.  Studies Reviewed:    EKG: Sinus rhythm, rate 66, axis within normal limits, short PR intervals, nonspecific ST-T wave changes.    Risk Assessment/Calculations:              Physical Exam:   VS:  BP 136/78   Ht  (1.727 m)   Wt 181 lb (82.1 kg)   SpO2 100%   BMI 27.52 kg/m    Wt Readings from Last 3 Encounters:  07/24/22 181 lb (82.1 kg)  04/29/22 184 lb (83.5 kg)  10/22/21 176 lb 9.6 oz (80.1 kg)     GEN: Well nourished, well developed in no acute distress NECK: No JVD; No carotid bruits CARDIAC: RRR, no murmurs, rubs, gallops RESPIRATORY:  Clear to auscultation without rales, wheezing or rhonchi  ABDOMEN: Soft, non-tender, non-distended EXTREMITIES:  No edema; No deformity   ASSESSMENT AND PLAN:   ECTOPY:   He has not noticed any palpitations.  No change in therapy.    HYPERTENSION:   I agree with the increase in amlodipine with a goal systolic in the 120s and he and I talked about this.  AORTIC ATHEROSCLEROSIS:   He does have some  aortic atherosclerosis.  He needs primary risk reduction.  DYSLIPIDEMIA: I actually stopped his Lipitor and saw him previously.  He is MESA score is 3.1.  There is no indication in this situation for statin based on current guidelines and patient preference plays a significant role here.  He and I discussed this.  There is room for him to move towards a Mediterranean plant-based diet and increase his physical activity.  I think this would be preferable therapy at this point.  Of note he is already taking an aspirin.  He would not be an indication to start this due to overall.  He is a marginal indication to keep it going the biggest point is that we know he tolerates it and has had no problems with that so I would not discontinue this.        Signed, Rollene Rotunda, MD

## 2022-07-24 ENCOUNTER — Ambulatory Visit: Payer: No Typology Code available for payment source | Attending: Cardiology | Admitting: Cardiology

## 2022-07-24 ENCOUNTER — Encounter: Payer: Self-pay | Admitting: Cardiology

## 2022-07-24 VITALS — BP 136/78 | HR 66 | Ht 68.0 in | Wt 181.0 lb

## 2022-07-24 DIAGNOSIS — I10 Essential (primary) hypertension: Secondary | ICD-10-CM | POA: Diagnosis not present

## 2022-07-24 DIAGNOSIS — R002 Palpitations: Secondary | ICD-10-CM | POA: Diagnosis not present

## 2022-07-24 DIAGNOSIS — I700 Atherosclerosis of aorta: Secondary | ICD-10-CM

## 2022-07-24 NOTE — Patient Instructions (Addendum)
Medication Instructions:  Your physician recommends that you continue on your current medications as directed. Please refer to the Current Medication list given to you today.  *If you need a refill on your cardiac medications before your next appointment, please call your pharmacy*   Lab Work: None   Testing/Procedures: None   Follow-Up: At [facility], you and your health needs are our priority.  As part of our continuing mission to provide you with exceptional heart care, we have created designated Provider Care Teams.  These Care Teams include your primary Cardiologist (physician) and Advanced Practice Providers (APPs -  Physician Assistants and Nurse Practitioners) who all work together to provide you with the care you need, when you need it.  Your next appointment:    As needed  Provider:   James Hochrein, MD  

## 2022-08-12 ENCOUNTER — Encounter: Payer: Self-pay | Admitting: Family Medicine

## 2022-08-12 ENCOUNTER — Ambulatory Visit (INDEPENDENT_AMBULATORY_CARE_PROVIDER_SITE_OTHER): Payer: No Typology Code available for payment source | Admitting: Family Medicine

## 2022-08-12 VITALS — BP 155/95 | HR 73 | Temp 98.0°F | Resp 16 | Wt 187.2 lb

## 2022-08-12 DIAGNOSIS — M2550 Pain in unspecified joint: Secondary | ICD-10-CM | POA: Diagnosis not present

## 2022-08-12 DIAGNOSIS — I10 Essential (primary) hypertension: Secondary | ICD-10-CM | POA: Diagnosis not present

## 2022-08-12 NOTE — Progress Notes (Signed)
Established Patient Office Visit  Subjective    Patient ID: Leonard Bryant, male    DOB: 1950/05/12  Age: 72 y.o. MRN: [id number]  CC: No chief complaint on file.   HPI Leonard Bryant presents for complaint of arthralgia that has been worsening over the past 3-4 weeks. Patient denies known trauma or injury.    Outpatient Encounter Medications as of 08/12/2022  Medication Sig   amLODipine (NORVASC) 10 MG tablet Take 1 tablet (10 mg total) by mouth daily.   aspirin EC 81 MG tablet Take 81 mg by mouth daily.   fluticasone (FLONASE) 50 MCG/ACT nasal spray Place 1 spray into both nostrils daily.   Multiple Vitamin (MULTIVITAMIN) tablet Take 1 tablet by mouth daily.   Travoprost, BAK Free, (TRAVATAN) 0.004 % SOLN ophthalmic solution INSTILL 1 DROP INTO EACH EYE ONCE DAILY AT NIGHT   [DISCONTINUED] benzonatate (TESSALON) 100 MG capsule Take 1 capsule (100 mg total) by mouth 2 (two) times daily as needed for cough. (Patient not taking: Reported on 07/24/2022)   No facility-administered encounter medications on file as of 08/12/2022.    Past Medical History:  Diagnosis Date   Arthritis    Possible arthritis in neck   Hypertension    Kidney stones     Past Surgical History:  Procedure Laterality Date   COLONOSCOPY     15 years ago   HERNIA REPAIR     Inguinal     Family History  Problem Relation Age of Onset   Diabetes Mother    Heart disease Mother    Stroke Mother    Clotting disorder Sister        Daughter also with clotting disorder.   Colon cancer Neg Hx    Esophageal cancer Neg Hx    Rectal cancer Neg Hx    Stomach cancer Neg Hx     Social History   Socioeconomic History   Marital status: Married    Spouse name: Not on file   Number of children: Not on file   Years of education: Not on file   Highest education level: Not on file  Occupational History   Not on file  Tobacco Use   Smoking status: Former    Packs/day: 1.50    Years: 5.00    Additional pack years:  0.00    Total pack years: 7.50    Types: Cigarettes   Smokeless tobacco: Never  Vaping Use   Vaping Use: Never used  Substance and Sexual Activity   Alcohol use: Yes    Comment: glass of wine or beer a day   Drug use: No   Sexual activity: Not on file  Other Topics Concern   Not on file  Social History Narrative   Lives wife wife.  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    Social Determinants of Health   Financial Resource Strain: Not on file  Food Insecurity: Not on file  Transportation Needs: Not on file  Physical Activity: Not on file  Stress: Not on file  Social Connections: Not on file  Intimate Partner Violence: Not on file    Review of Systems  Musculoskeletal:  Positive for joint pain.  All other systems reviewed and are negative.       Objective    BP (!) 155/95   Pulse 73   Temp 98 F (36.7 C) (Oral)   Resp 16   Wt 187 lb 3.2 oz (84.9 kg)   SpO2 97%  BMI 28.46 kg/m   Physical Exam Vitals and nursing note reviewed.  Constitutional:      General: He is not in acute distress. Cardiovascular:     Rate and Rhythm: Normal rate and regular rhythm.  Pulmonary:     Effort: Pulmonary effort is normal.     Breath sounds: Normal breath sounds.  Musculoskeletal:        General: Tenderness present.  Neurological:     General: No focal deficit present.     Mental Status: He is alert and oriented to person, place, and time.         Assessment & Plan:   1. Arthralgia, unspecified joint Labs ordered - Sedimentation Rate - Rheumatoid factor - ANA - Basic Metabolic Panel - CBC with Differential  2. Essential hypertension Slightly elevated readings. Continue present management and monitor    Return if symptoms worsen or fail to improve.   Leonard Raymond, MD

## 2022-08-13 ENCOUNTER — Ambulatory Visit (HOSPITAL_BASED_OUTPATIENT_CLINIC_OR_DEPARTMENT_OTHER)
Admission: RE | Admit: 2022-08-13 | Discharge: 2022-08-13 | Disposition: A | Discharge status: Home / Self Care | Payer: No Typology Code available for payment source | Source: Ambulatory Visit | Attending: Internal Medicine | Admitting: Internal Medicine

## 2022-08-13 ENCOUNTER — Encounter (HOSPITAL_COMMUNITY): Payer: Self-pay | Admitting: Emergency Medicine

## 2022-08-13 ENCOUNTER — Ambulatory Visit (HOSPITAL_COMMUNITY)
Admission: EM | Admit: 2022-08-13 | Discharge: 2022-08-13 | Disposition: A | Discharge status: Home / Self Care | Payer: No Typology Code available for payment source | Attending: Internal Medicine | Admitting: Internal Medicine

## 2022-08-13 DIAGNOSIS — M7989 Other specified soft tissue disorders: Secondary | ICD-10-CM | POA: Insufficient documentation

## 2022-08-13 DIAGNOSIS — M79602 Pain in left arm: Secondary | ICD-10-CM

## 2022-08-13 DIAGNOSIS — R202 Paresthesia of skin: Secondary | ICD-10-CM | POA: Insufficient documentation

## 2022-08-13 LAB — CBC WITH DIFFERENTIAL/PLATELET
Basophils Absolute: 0 10*3/uL (ref 0.0–0.2)
Basos: 0 %
EOS (ABSOLUTE): 0.1 10*3/uL (ref 0.0–0.4)
Eos: 2 %
Hematocrit: 38.1 % (ref 37.5–51.0)
Hemoglobin: 13 g/dL (ref 13.0–17.7)
Immature Grans (Abs): 0 10*3/uL (ref 0.0–0.1)
Immature Granulocytes: 0 %
Lymphocytes Absolute: 0.8 10*3/uL (ref 0.7–3.1)
Lymphs: 14 %
MCH: 27.8 pg (ref 26.6–33.0)
MCHC: 34.1 g/dL (ref 31.5–35.7)
MCV: 81 fL (ref 79–97)
Monocytes Absolute: 0.4 10*3/uL (ref 0.1–0.9)
Monocytes: 8 %
Neutrophils Absolute: 4.4 10*3/uL (ref 1.4–7.0)
Neutrophils: 76 %
Platelets: 260 10*3/uL (ref 150–450)
RBC: 4.68 x10E6/uL (ref 4.14–5.80)
RDW: 12.6 % (ref 11.6–15.4)
WBC: 5.8 10*3/uL (ref 3.4–10.8)

## 2022-08-13 LAB — RHEUMATOID FACTOR: Rheumatoid fact SerPl-aCnc: 53.6 IU/mL — ABNORMAL HIGH (ref <14.0)

## 2022-08-13 LAB — BASIC METABOLIC PANEL
BUN/Creatinine Ratio: 14 (ref 10–24)
BUN: 11 mg/dL (ref 8–27)
CO2: 26 mmol/L (ref 20–29)
Calcium: 10.5 mg/dL — ABNORMAL HIGH (ref 8.6–10.2)
Chloride: 103 mmol/L (ref 96–106)
Creatinine, Ser: 0.78 mg/dL (ref 0.76–1.27)
Glucose: 96 mg/dL (ref 70–99)
Potassium: 4.7 mmol/L (ref 3.5–5.2)
Sodium: 140 mmol/L (ref 134–144)
eGFR: 96 mL/min/{1.73_m2} (ref >59)

## 2022-08-13 LAB — ANA: Anti Nuclear Antibody (ANA): NEGATIVE

## 2022-08-13 LAB — SEDIMENTATION RATE: Sed Rate: 11 mm/hr (ref 0–30)

## 2022-08-13 MED ORDER — PREDNISONE 10 MG (21) PO TBPK: ORAL_TABLET | Freq: Every day | ORAL | 0 refills | Status: DC

## 2022-08-13 NOTE — ED Notes (Signed)
Scheduled for 11:30 today at [facility]

## 2022-08-13 NOTE — ED Triage Notes (Signed)
Since Sunday having left hand pain that radiates to left elbow. Denies injury. Has some swelling. Unable to close a grip. Went to [facility] yesterday and did blood work that should be back today. Didn't prescribe medications.

## 2022-08-13 NOTE — Discharge Instructions (Signed)
Go to [facility] for an ultrasound of your arm to rule out a blood clot Take prednisone pack for pain and swelling

## 2022-08-13 NOTE — ED Provider Notes (Signed)
[facility]   [id number] 08/13/22 Arrival Time: 0801  ASSESSMENT & PLAN:  1. Left arm pain    -Patient with 3 days of unilateral upper extremity swelling.  He has blood work pending for rheumatologic/autoimmune workup.  I cannot comfortably rule out DVT with his clinical presentation today.  I discussed risk/benefits with the patient recommended he go to [facility] to get a ultrasound to evaluate for DVT.  He voiced understanding and will go there for a scan.  I will call him in a prednisone taper to try to relieve some pain and swelling.  All questions answered and agrees to plan.  Meds ordered this encounter  Medications   predniSONE (STERAPRED UNI-PAK 21 TAB) 10 MG (21) TBPK tablet    Sig: Take by mouth daily. Take 6 tabs by mouth daily  for 1 day, then 5 tabs for 1 day, then 4 tabs for 1 day, then 3 tabs for 1 day, 2 tabs for 1 day, then 1 tab by mouth for 1 day    Dispense:  21 tablet    Refill:  0     Discharge Instructions      Go to [facility] for an ultrasound of your arm to rule out a blood clot Take prednisone pack for pain and swelling        Reviewed expectations re: course of current medical issues. Questions answered. Outlined signs and symptoms indicating need for more acute intervention. Patient verbalized understanding. After Visit Summary given.   SUBJECTIVE: Leonard 72 year old Bryant comes urgent care to be evaluated for left arm pain and swelling.  Says it started about 3 days ago after he mowed his lawn.  He went to urgent care at [facility] and was evaluated yesterday with some blood work.  He was not ordered for any imaging did not get any medications for pain.  Says the pain has been keeping him up at night.  The pain starts at the elbow and radiates down to the forearm into the fingers.  He says the fingers are numb and puffy and he cannot make a fist.  No inciting injury or trauma.  He is only been taking ibuprofen which is not helping.  Former  smoker.  No history of blood clots.  Does take amlodipine for blood pressure which has been causing some bilateral ankle swelling  No LMP for Bryant patient. Past Surgical History:  Procedure Laterality Date   COLONOSCOPY     15 years ago   HERNIA REPAIR     Inguinal      OBJECTIVE:  Vitals:   08/13/22 0818  BP: (!) 161/86  Pulse: 82  Resp: 17  Temp: 98.8 F (37.1 C)  TempSrc: Oral  SpO2: 98%     Physical Exam Vitals and nursing note reviewed.  Constitutional:      Appearance: Normal appearance. He is not ill-appearing.  Cardiovascular:     Rate and Rhythm: Normal rate.  Pulmonary:     Effort: Pulmonary effort is normal.  Musculoskeletal:     Comments: Left upper extremity -there is an obvious soft tissue swelling starting at the level of the elbow down into the fingers.  It is noticeably larger than the contralateral side.  There are no overlying skin changes.  He is tender to palpation along the dorsal and volar aspects of the forearm.  He is unable to make a fist due to the swelling of his fingers.  He is nontender to palpation at any of the  IP joints.  He has 2+ radial pulse.  Skin:    Capillary Refill: Capillary refill takes less than 2 seconds.  Neurological:     General: No focal deficit present.  Psychiatric:        Mood and Affect: Mood normal.      Labs: Results for orders placed or performed in visit on 04/29/22  COVID-19, Flu A+B and RSV   Specimen: Nasal Swab  Result Value Ref Range   SARS-CoV-2, NAA Not Detected Not Detected   Influenza A, NAA Not Detected Not Detected   Influenza B, NAA Not Detected Not Detected   RSV, NAA Not Detected Not Detected   Test Information: Comment   CBC With Diff/Platelet  Result Value Ref Range   WBC 4.0 3.4 - 10.8 x10E3/uL   RBC 4.59 4.14 - 5.80 x10E6/uL   Hemoglobin 12.9 (L) 13.0 - 17.7 g/dL   Hematocrit 13.0 86.5 - 51.0 %   MCV 83 79 - 97 fL   MCH 28.1 26.6 - 33.0 pg   MCHC 33.8 31.5 - 35.7 g/dL   RDW 78.4  69.6 - 29.5 %   Platelets 161 150 - 450 x10E3/uL   Neutrophils 66 Not Estab. %   Lymphs 19 Not Estab. %   Monocytes 11 Not Estab. %   Eos 3 Not Estab. %   Basos 1 Not Estab. %   Neutrophils Absolute 2.7 1.4 - 7.0 x10E3/uL   Lymphocytes Absolute 0.7 0.7 - 3.1 x10E3/uL   Monocytes Absolute 0.4 0.1 - 0.9 x10E3/uL   EOS (ABSOLUTE) 0.1 0.0 - 0.4 x10E3/uL   Basophils Absolute 0.0 0.0 - 0.2 x10E3/uL   Immature Granulocytes 0 Not Estab. %   Immature Grans (Abs) 0.0 0.0 - 0.1 x10E3/uL   Labs Reviewed - No data to display  Imaging: No results found.   Allergies  Allergen Reactions   Pseudoephedrine Other (See Comments)    Testicular pain and tenderness   Penicillins Rash    Has patient had a PCN reaction causing immediate rash, facial/tongue/throat swelling, SOB or lightheadedness with hypotension: No Has patient had a PCN reaction causing severe rash involving mucus membranes or skin necrosis: No Has patient had a PCN reaction that required hospitalization No Has patient had a PCN reaction occurring within the last 10 years: No If all of the above answers are "NO", then may proceed with Cephalosporin use.                                                Past Medical History:  Diagnosis Date   Arthritis    Possible arthritis in neck   Hypertension    Kidney stones     Social History   Socioeconomic History   Marital status: Married    Spouse name: Not on file   Number of children: Not on file   Years of education: Not on file   Highest education level: Not on file  Occupational History   Not on file  Tobacco Use   Smoking status: Former    Packs/day: 1.50    Years: 5.00    Additional pack years: 0.00    Total pack years: 7.50    Types: Cigarettes   Smokeless tobacco: Never  Vaping Use   Vaping Use: Never used  Substance and Sexual Activity   Alcohol use: Yes  Comment: glass of wine or beer a day   Drug use: No   Sexual activity: Not on file  Other Topics  Concern   Not on file  Social History Narrative   Lives wife wife.  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    Social Determinants of Health   Financial Resource Strain: Not on file  Food Insecurity: Not on file  Transportation Needs: Not on file  Physical Activity: Not on file  Stress: Not on file  Social Connections: Not on file  Intimate Partner Violence: Not on file    Family History  Problem Relation Age of Onset   Diabetes Mother    Heart disease Mother    Stroke Mother    Clotting disorder Sister        Daughter also with clotting disorder.   Colon cancer Neg Hx    Esophageal cancer Neg Hx    Rectal cancer Neg Hx    Stomach cancer Neg Hx    Get a   Abrial Arrighi, Baldemar Friday, MD 08/13/22 [id number]

## 2022-08-15 ENCOUNTER — Encounter: Payer: Self-pay | Admitting: Family Medicine

## 2022-08-26 DIAGNOSIS — R972 Elevated prostate specific antigen [PSA]: Secondary | ICD-10-CM | POA: Diagnosis not present

## 2022-09-01 ENCOUNTER — Other Ambulatory Visit: Payer: Self-pay

## 2022-09-01 DIAGNOSIS — R972 Elevated prostate specific antigen [PSA]: Secondary | ICD-10-CM | POA: Diagnosis not present

## 2022-09-01 DIAGNOSIS — R3915 Urgency of urination: Secondary | ICD-10-CM | POA: Diagnosis not present

## 2022-09-01 NOTE — Progress Notes (Signed)
   Leonard Bryant 04/19/1950 [id number]  Patient outreached by Seward Meth , PharmD Candidate on 09/01/22.  Blood Pressure Readings: Last documented ambulatory systolic blood pressure: 161 Last documented ambulatory diastolic blood pressure: 86 Does the patient have a validated home blood pressure machine?: Yes They report home readings 127-130's/79  Medication review was performed. Is the patient taking their medications as prescribed?: Yes   The following barriers to adherence were noted: Does the patient have cost concerns?: No Does the patient have transportation concerns?: No Does the patient need assistance obtaining refills?: No Does the patient occassionally forget to take some of their prescribed medications?: No Does the patient feel like one/some of their medications make them feel poorly?: Yes - pt states he was experiencing generalized muscle pain and upon research, believes it could be due to his amlodipine. He started taking half a tablet (5 mg) and drinking more water and his BP has since gone up a little bit but the muscle soreness has gone away.  Does the patient have questions or concerns about their medications?: Yes - pt is not sure if amlodipine is the best option to manage his HTN.  Does the patient have a follow up scheduled with their primary care provider/cardiologist?: Yes   Interventions: Interventions Completed: Medications were reviewed, Patient educated on the importance of at home BP monitoring and to bring his readings to his appointment.   The patient has follow up scheduled:  PCP: Marcine Matar, MD   Seward Meth, Student-PharmD

## 2022-09-02 ENCOUNTER — Ambulatory Visit: Payer: No Typology Code available for payment source | Attending: Internal Medicine | Admitting: Internal Medicine

## 2022-09-02 VITALS — BP 144/88 | HR 70 | Temp 98.5°F | Ht 68.0 in | Wt 186.0 lb

## 2022-09-02 DIAGNOSIS — M2559 Pain in other specified joint: Secondary | ICD-10-CM

## 2022-09-02 DIAGNOSIS — I10 Essential (primary) hypertension: Secondary | ICD-10-CM

## 2022-09-02 DIAGNOSIS — E785 Hyperlipidemia, unspecified: Secondary | ICD-10-CM

## 2022-09-02 MED ORDER — FLUTICASONE PROPIONATE 50 MCG/ACT NA SUSP: 1.0000 | Freq: Every day | NASAL | 1 refills | Status: DC

## 2022-09-02 NOTE — Progress Notes (Signed)
Patient ID: Leonard Bryant, male    DOB: February 19, 1951  MRN: [id number]  CC: Hypertension (HTN f/u.  Med refills./Repots taking half of amlodipine medication. /Past pain on L & R shoulders & L arm X2 weeks)   Subjective: Leonard Bryant is a 72 y.o. male who presents for chronic ds management His concerns today include:  Pt with hx of HTN, glaucoma, BPH, mild aortic atherosclerosis declines statin, dec hearing   HTN: Patient is suppose to be on Norvasc 10 mg daily.  However, pt reports he has been taking only 1/2 tab daily and took already for today.  Was having joint pains across his shoulders.  He looked up Norvasc and found that it can cause some jt pains.  Seen by Dr. Andrey Campanile on the 14th of last month for arthralgias.  Rheumatoid factor was elevated at 53.6.  Patient referred to rheumatology; pt was not aware of the referral or that Dr. Gregary Cromer office had tried to contact him. Also seen at [facility] 08/13/22 for swelling and discomfort in LT hand  and UE.  Doppler negative for DVT.  Given Prednisone for several days.  Symptoms improved.  He also feels he was not drinking much water but started doing so.  -has not been checking BP regularly except when he was in pain. BP was elev when in pain -limits salt in foods  HL/arthrosclerosis: Saw Dr. Antoine Poche since last visit.  He reports that statin therapy is not indicated for the patient at this time.   Patient Active Problem List   Diagnosis Date Noted   Palpitations 07/23/2022   Glaucoma 01/01/2021   Elevated PSA 01/01/2021   Decreased hearing of both ears 01/01/2021   Premature atrial contractions 10/01/2020   Aortic atherosclerosis (HCC) 10/01/2020   PVC (premature ventricular contraction) 06/28/2020   Floaters in visual field, right 06/28/2020   History of COVID-19 04/13/2020   Essential hypertension 07/06/2019   Refusal of blood transfusions as patient is Jehovah's Witness 05/23/2014   BPH (benign prostatic hyperplasia) 03/15/2014   Erectile  dysfunction 03/15/2014   Leukopenia 05/06/2012   Allergic rhinitis 05/06/2012   Hyperlipidemia      Current Outpatient Medications on File Prior to Visit  Medication Sig Dispense Refill   amLODipine (NORVASC) 10 MG tablet Take 1 tablet (10 mg total) by mouth daily. 90 tablet 1   aspirin EC 81 MG tablet Take 81 mg by mouth daily.     Multiple Vitamin (MULTIVITAMIN) tablet Take 1 tablet by mouth daily.     Travoprost, BAK Free, (TRAVATAN) 0.004 % SOLN ophthalmic solution INSTILL 1 DROP INTO EACH EYE ONCE DAILY AT NIGHT     No current facility-administered medications on file prior to visit.    Allergies  Allergen Reactions   Pseudoephedrine Other (See Comments)    Testicular pain and tenderness   Penicillins Rash    Has patient had a PCN reaction causing immediate rash, facial/tongue/throat swelling, SOB or lightheadedness with hypotension: No Has patient had a PCN reaction causing severe rash involving mucus membranes or skin necrosis: No Has patient had a PCN reaction that required hospitalization No Has patient had a PCN reaction occurring within the last 10 years: No If all of the above answers are "NO", then may proceed with Cephalosporin use.     Social History   Socioeconomic History   Marital status: Married    Spouse name: Not on file   Number of children: Not on file   Years of education: Not  on file   Highest education level: 12th grade  Occupational History   Not on file  Tobacco Use   Smoking status: Former    Packs/day: 1.50    Years: 5.00    Additional pack years: 0.00    Total pack years: 7.50    Types: Cigarettes   Smokeless tobacco: Never  Vaping Use   Vaping Use: Never used  Substance and Sexual Activity   Alcohol use: Yes    Comment: glass of wine or beer a day   Drug use: No   Sexual activity: Not on file  Other Topics Concern   Not on file  Social History Narrative   Lives wife wife.  Grown children.  Retired post Holiday representative.    Social Determinants of Health   Financial Resource Strain: Low Risk  (09/02/2022)   Overall Financial Resource Strain (CARDIA)    Difficulty of Paying Living Expenses: Not hard at all  Food Insecurity: No Food Insecurity (09/02/2022)   Hunger Vital Sign    Worried About Running Out of Food in the Last Year: Never true    Ran Out of Food in the Last Year: Never true  Transportation Needs: No Transportation Needs (09/02/2022)   PRAPARE - Administrator, Civil Service (Medical): No    Lack of Transportation (Non-Medical): No  Physical Activity: Sufficiently Active (09/02/2022)   Exercise Vital Sign    Days of Exercise per Week: 7 days    Minutes of Exercise per Session: 30 min  Stress: No Stress Concern Present (09/02/2022)   [employer] of Occupational Health - Occupational Stress Questionnaire    Feeling of Stress : Not at all  Social Connections: Socially Integrated (09/02/2022)   Social Connection and Isolation Panel [NHANES]    Frequency of Communication with Friends and Family: More than three times a week    Frequency of Social Gatherings with Friends and Family: Not on file    Attends Religious Services: More than 4 times per year    Active Member of [employer] or Organizations: Yes    Attends Engineer, structural: More than 4 times per year    Marital Status: Married  Catering manager Violence: Not on file    Family History  Problem Relation Age of Onset   Diabetes Mother    Heart disease Mother    Stroke Mother    Clotting disorder Sister        Daughter also with clotting disorder.   Colon cancer Neg Hx    Esophageal cancer Neg Hx    Rectal cancer Neg Hx    Stomach cancer Neg Hx     Past Surgical History:  Procedure Laterality Date   COLONOSCOPY     15 years ago   HERNIA REPAIR     Inguinal     ROS: Review of Systems Negative except as stated above  PHYSICAL EXAM: BP (!) 144/88   Pulse 70   Temp 98.5 F (36.9 C) (Oral)    Ht 5\' 8"  (1.727 m)   Wt 186 lb (84.4 kg)   SpO2 99%   BMI 28.28 kg/m   Physical Exam   General appearance - alert, well appearing, and in no distress Mental status - normal mood, behavior, speech, dress, motor activity, and thought processes Chest - clear to auscultation, no wheezes, rales or rhonchi, symmetric air entry Heart - normal rate, regular rhythm, normal S1, S2, no murmurs, rubs, clicks or gallops Extremities - peripheral  pulses normal, no pedal edema, no clubbing or cyanosis MSK: no signs of active inflammation in the jts of the hands; small boney nodule on both thumb    Latest Ref Rng & Units 08/12/2022    1:43 PM 10/22/2021   11:29 AM 06/01/2021   12:34 PM  CMP  Glucose 70 - 99 mg/dL 96  87  57   BUN 8 - 27 mg/dL 11  13  15    Creatinine 0.76 - 1.27 mg/dL 4.09  8.11  9.14   Sodium 134 - 144 mmol/L 140  140  139   Potassium 3.5 - 5.2 mmol/L 4.7  4.5  4.1   Chloride 96 - 106 mmol/L 103  106  107   CO2 20 - 29 mmol/L 26  23  26    Calcium 8.6 - 10.2 mg/dL 78.2  95.6  21.3   Total Protein 6.0 - 8.5 g/dL  6.7    Total Bilirubin 0.0 - 1.2 mg/dL  0.6    Alkaline Phos 44 - 121 IU/L  54    AST 0 - 40 IU/L  17    ALT 0 - 44 IU/L  13     Lipid Panel     Component Value Date/Time   CHOL 161 10/22/2021 1129   TRIG 57 10/22/2021 1129   HDL 47 10/22/2021 1129   CHOLHDL 3.4 10/22/2021 1129   CHOLHDL 3.3 03/15/2014 1113   VLDL 15 03/15/2014 1113   LDLCALC 102 (H) 10/22/2021 1129   LDLDIRECT 106 (H) 05/05/2012 1032    CBC    Component Value Date/Time   WBC 5.8 08/12/2022 1343   WBC 6.8 06/01/2021 1234   RBC 4.68 08/12/2022 1343   RBC 4.98 06/01/2021 1234   HGB 13.0 08/12/2022 1343   HCT 38.1 08/12/2022 1343   PLT 260 08/12/2022 1343   MCV 81 08/12/2022 1343   MCH 27.8 08/12/2022 1343   MCH 28.3 06/01/2021 1234   MCHC 34.1 08/12/2022 1343   MCHC 33.1 06/01/2021 1234   RDW 12.6 08/12/2022 1343   LYMPHSABS 0.8 08/12/2022 1343   MONOABS 0.5 06/01/2021 1234   EOSABS  0.1 08/12/2022 1343   BASOSABS 0.0 08/12/2022 1343    ASSESSMENT AND PLAN: 1. Essential hypertension Not at goal. He has been taking Norvasc 5 mg daily instead of the 10 mg tab. He is not sure that the decrease dose has made a difference in his jt symptoms.  Discussed keeping him on 5 mg but adding another BP med to get BP at goal.  Pt prefers to go back to 10 mg of Norvasc.  Advise to let me know if he has any increase in jt symptoms once he resumes the 10 mg dose.  2. Hyperlipidemia, unspecified hyperlipidemia type No need for statin Continue healthy eating habits  3. Pain in other joint Pt given info for the rheumatologist office.  Advised to call and schedule appt      Patient was given the opportunity to ask questions.  Patient verbalized understanding of the plan and was able to repeat key elements of the plan.   This documentation was completed using Paediatric nurse.  Any transcriptional errors are unintentional.  No orders of the defined types were placed in this encounter.    Requested Prescriptions   Signed Prescriptions Disp Refills   fluticasone (FLONASE) 50 MCG/ACT nasal spray 16 g 1    Sig: Place 1 spray into both nostrils daily.    Return in about 5  months (around 02/02/2023).  Jonah Blue, MD, FACP

## 2022-09-02 NOTE — Patient Instructions (Addendum)
Please call [facility] to schedule an appointment [address] [place], [place]  [address] Ph# [phone]  Your blood pressure is not at goal.  Goal is 130/80 or lower.  Increase the amlodipine back to 10 mg daily as discussed.  Let me know if you have any further joint aches when you do so.

## 2022-09-10 ENCOUNTER — Ambulatory Visit: Payer: No Typology Code available for payment source | Attending: Internal Medicine | Admitting: Internal Medicine

## 2022-09-10 ENCOUNTER — Encounter: Payer: Self-pay | Admitting: Internal Medicine

## 2022-09-10 VITALS — BP 144/79 | HR 79 | Temp 98.4°F | Ht 68.0 in | Wt 186.0 lb

## 2022-09-10 DIAGNOSIS — I10 Essential (primary) hypertension: Secondary | ICD-10-CM

## 2022-09-10 DIAGNOSIS — D649 Anemia, unspecified: Secondary | ICD-10-CM | POA: Diagnosis not present

## 2022-09-10 DIAGNOSIS — M1990 Unspecified osteoarthritis, unspecified site: Secondary | ICD-10-CM

## 2022-09-10 DIAGNOSIS — L80 Vitiligo: Secondary | ICD-10-CM

## 2022-09-10 DIAGNOSIS — R600 Localized edema: Secondary | ICD-10-CM

## 2022-09-10 MED ORDER — PREDNISONE 20 MG PO TABS: ORAL_TABLET | ORAL | 0 refills | Status: DC

## 2022-09-10 MED ORDER — COLCHICINE 0.6 MG PO TABS
ORAL_TABLET | ORAL | 0 refills | Status: DC
Start: 2022-09-10 — End: 2023-03-17

## 2022-09-10 NOTE — Patient Instructions (Signed)
Gout  Gout is painful swelling of your joints. Gout is a type of arthritis. It is caused by having too much uric acid in your body. Uric acid is a chemical that is made when your body breaks down substances called purines. If your body has too much uric acid, sharp crystals can form and build up in your joints. This causes pain and swelling. Gout attacks can happen quickly and be very painful (acute gout). Over time, the attacks can affect more joints and happen more often (chronic gout). What are the causes? Gout is caused by too much uric acid in your blood. This can happen because: Your kidneys do not remove enough uric acid from your blood. Your body makes too much uric acid. You eat too many foods that are high in purines. These foods include organ meats, some seafood, and beer. Trauma or stress can bring on an attack. What increases the risk? Having a family history of gout. Being male and middle-aged. Being male and having gone through menopause. Having an organ transplant. Taking certain medicines. Having certain conditions, such as: Being very overweight (obese). Lead poisoning. Kidney disease. A skin condition called psoriasis. Other risks include: Losing weight too quickly. Not having enough water in the body (being dehydrated). Drinking alcohol, especially beer. Drinking beverages that are sweetened with a type of sugar called fructose. What are the signs or symptoms? An attack of acute gout often starts at night and usually happens in just one joint. The most common place is the big toe. Other joints that may be affected include joints of the feet, ankle, knee, fingers, wrist, or elbow. Symptoms may include: Very bad pain. Warmth. Swelling. Stiffness. Tenderness. The affected joint may be very painful to touch. Shiny, red, or purple skin. Chills and fever. Chronic gout may cause symptoms more often. More joints may be involved. You may also have white or yellow lumps  (tophi) on your hands or feet or in other areas near your joints. How is this treated? Treatment for an acute attack may include medicines for pain and swelling, such as: NSAIDs, such as ibuprofen. Steroids taken by mouth or injected into a joint. Colchicine. This can be given by mouth or through an IV tube. Treatment to prevent future attacks may include: Taking small doses of NSAIDs or colchicine daily. Using a medicine that reduces uric acid levels in your blood, such as allopurinol. Making changes to your diet. You may need to see a food expert (dietitian) about what to eat and drink to prevent gout. Follow these instructions at home: During a gout attack  If told, put ice on the painful area. To do this: Put ice in a plastic bag. Place a towel between your skin and the bag. Leave the ice on for 20 minutes, 2-3 times a day. Take off the ice if your skin turns bright red. This is very important. If you cannot feel pain, heat, or cold, you have a greater risk of damage to the area. Raise the painful joint above the level of your heart as often as you can. Rest the joint as much as possible. If the joint is in your leg, you may be given crutches. Follow instructions from your doctor about what you cannot eat or drink. Avoiding future gout attacks Eat a low-purine diet. Avoid foods and drinks such as: Liver. Kidney. Anchovies. Asparagus. Herring. Mushrooms. Mussels. Beer. Stay at a healthy weight. If you want to lose weight, talk with your doctor. Do not  lose weight too fast. Start or continue an exercise plan as told by your doctor. Eating and drinking Avoid drinks sweetened by fructose. Drink enough fluids to keep your pee (urine) pale yellow. If you drink alcohol: Limit how much you have to: 0-1 drink a day for women who are not pregnant. 0-2 drinks a day for men. Know how much alcohol is in a drink. In the [place] one drink equals one 12 oz bottle of beer (355 mL), one 5 oz  glass of wine (148 mL), or one 1 oz glass of hard liquor (44 mL). General instructions Take over-the-counter and prescription medicines only as told by your doctor. Ask your doctor if you should avoid driving or using machines while you are taking your medicine. Return to your normal activities when your doctor says that it is safe. Keep all follow-up visits. Where to find more information [employer]: www.niams.http://www.myers.net/ Contact a doctor if: You have another gout attack. You still have symptoms of a gout attack after 10 days of treatment. You have problems (side effects) because of your medicines. You have chills or a fever. You have burning pain when you pee (urinate). You have pain in your lower back or belly. Get help right away if: You have very bad pain. Your pain cannot be controlled. You cannot pee. Summary Gout is painful swelling of the joints. The most common site of pain is the big toe, but it can affect other joints. Medicines and avoiding some foods can help to prevent and treat gout attacks. This information is not intended to replace advice given to you by your health care provider. Make sure you discuss any questions you have with your health care provider. Document Revised: 12/19/2020 Document Reviewed: 12/19/2020 Elsevier Patient Education  2024 Elsevier Inc.  Vitiligo Vitiligo is a long-term, or chronic, skin disease that causes loss of skin color in patches (depigmentation). In this condition, areas of the skin that lose pigment turn milky white. This condition affects all types of skin but is most apparent in people with darker skin. Some people may also lose color in their hair or eyes or inside their mouths. Vitiligo usually starts before age 84, but it can occur at any age. There are two main types of vitiligo: Nonsegmental or bilateral vitiligo. This is the more common type. It affects both sides of the body, usually on the face, hands, arms,  knees, or feet. This type starts suddenly and may come and go over your lifetime. Segmental or unilateral vitiligo. This type happens on only one side of the body, often on the face, arm, or leg. Some people also lose hair color. This type may start at an early age, get worse for a while, and then stop. Vitiligo is not spread from person to person. It is not dangerous to your health but can cause embarrassment and emotional stress. Some people with this condition may have a higher risk for hearing or vision loss. What are the causes? The exact cause of vitiligo is unknown. It may be an autoimmune disease. This is a type of disease that makes your body's defense system (immune system) mistakenly attack normal cells instead of germs. If your immune system attacks color-producing cells (melanocytes), it may cause vitiligo. When these cells die, you lose color. Sometimes, vitiligo is passed down through families, so it may also be a genetic condition. What increases the risk? The following factors may make you more likely to develop this condition: Having a  family history of the condition. Having another autoimmune disease, such as rheumatoid arthritis, autoimmune thyroid disease, or type 1 diabetes. What are the signs or symptoms?  The only sign of this condition is a loss of color in your skin, hair, or eye, or inside of your mouth. How is this diagnosed? This condition may be diagnosed by patches of depigmentation on your skin. You may also have blood tests to check for other types of autoimmune disease. How is this treated? There is no cure for this condition, and it is not dangerous to your health. However, you may choose to treat vitiligo if having it makes you feel uncomfortable or stressed about your appearance. Treatment will not cure your condition, but it may restore some skin color or prevent your condition from getting worse. Treatment options include: Makeup or self-tanning lotions. These  can add color in depigmented areas. Hair dye may be used for white patches of hair. Skin creams, such as steroid creams, vitamin D creams, and creams that block the immune system. Ultraviolet light treatment. Medicines that cause depigmentation. These may be used to lighten darker skin so that white patches will be less visible. Surgery to remove pigmented skin from one area and place it in depigmented areas (skin grafting) or to add color with surgical tattooing. Follow these instructions at home: Take over-the-counter and prescription medicines only as told by your health care provider. Protect your skin from the sun. Sunburn can make vitiligo worse. Use sunscreen with an SPF of 30 or higher. Cover areas of affected skin with clothing or wear a hat when you are out in the sun. Manage stress. Stress can make this condition worse. Seek counseling and support if having vitiligo makes you feel anxious or depressed. Keep all follow-up visits. This is important. Contact a health care provider if: Your condition is getting worse. You are struggling with depression or anxiety. You notice any changes in hearing or vision. Get help right away if: Your condition is quickly getting worse. If you ever feel like you may hurt yourself or others, or have thoughts about taking your own life, get help right away. Go to your nearest emergency department or: Call your local emergency services (911 in the [place] Call a suicide crisis helpline, such as the National Suicide Prevention Lifeline at 843-857-7753 or 988 in the [place] This is open 24 hours a day in the [place] Text the Crisis Text Line at 757-080-9067 (in the [place] Summary Vitiligo is a long-term (chronic) skin disease that causes loss of skin color in patches. Some people with this condition may also lose color in their hair or eyes or inside their mouths. This condition is diagnosed by the signs of depigmentation. Blood testing may be done to look for  other types of autoimmune disease. There is no cure for this condition, and it is not dangerous to your health. If your condition makes you feel uncomfortable or stressed about your appearance, you may consider treatment, such as skin creams or medicines. This information is not intended to replace advice given to you by your health care provider. Make sure you discuss any questions you have with your health care provider. Document Revised: 10/10/2020 Document Reviewed: 09/05/2019 Elsevier Patient Education  2024 [employer].

## 2022-09-10 NOTE — Progress Notes (Addendum)
Patient ID: Leonard Bryant, male    DOB: September 15, 1950  MRN: [id number]  CC: Leg Swelling (R foot & leg swelling X3 days/Swelling on L hand X3 days )   Subjective: Leonard Bryant is a 72 y.o. male who presents for UC visit His concerns today include:  Pt with hx of HTN, glaucoma, BPH, mild aortic atherosclerosis declines statin, dec hearing   Patient presents today complaining of pain and swelling of the left hand, right knee and foot x 3 days. No associated fever or chills. He has had to ambulate with a crutch over the past 24 hours. Was seen in urgent care about a month ago with left forearm and hand swelling.  He was placed on prednisone taper with resolution of symptoms.  On further review of his chart, he was also seen in March of last year with inflammation of the right hand and wrist.  He was placed on prednisone at that time as well.  Uric acid level at that time was normal. -He was referred to rheumatology after he was seen by Dr. Andrey Campanile last month.  He did call the rheumatologist office, Dr. Dimple Casey, and they have scheduled him for October.  He wonders whether the amlodipine is causing the joint symptoms.  He has been on amlodipine for years.  We increased the dose from 5 mg to 10 mg in January of this year.  He has not taken it for the past 2 days.       Patient Active Problem List   Diagnosis Date Noted   Palpitations 07/23/2022   Glaucoma 01/01/2021   Elevated PSA 01/01/2021   Decreased hearing of both ears 01/01/2021   Premature atrial contractions 10/01/2020   Aortic atherosclerosis (HCC) 10/01/2020   PVC (premature ventricular contraction) 06/28/2020   Floaters in visual field, right 06/28/2020   History of COVID-19 04/13/2020   Essential hypertension 07/06/2019   Refusal of blood transfusions as patient is Jehovah's Witness 05/23/2014   BPH (benign prostatic hyperplasia) 03/15/2014   Erectile dysfunction 03/15/2014   Leukopenia 05/06/2012   Allergic rhinitis 05/06/2012    Hyperlipidemia      Current Outpatient Medications on File Prior to Visit  Medication Sig Dispense Refill   amLODipine (NORVASC) 10 MG tablet Take 1 tablet (10 mg total) by mouth daily. 90 tablet 1   aspirin EC 81 MG tablet Take 81 mg by mouth daily.     fluticasone (FLONASE) 50 MCG/ACT nasal spray Place 1 spray into both nostrils daily. 16 g 1   Multiple Vitamin (MULTIVITAMIN) tablet Take 1 tablet by mouth daily.     tamsulosin (FLOMAX) 0.4 MG CAPS capsule Take 0.4 mg by mouth.     Travoprost, BAK Free, (TRAVATAN) 0.004 % SOLN ophthalmic solution INSTILL 1 DROP INTO EACH EYE ONCE DAILY AT NIGHT     No current facility-administered medications on file prior to visit.    Allergies  Allergen Reactions   Pseudoephedrine Other (See Comments)    Testicular pain and tenderness   Penicillins Rash    Has patient had a PCN reaction causing immediate rash, facial/tongue/throat swelling, SOB or lightheadedness with hypotension: No Has patient had a PCN reaction causing severe rash involving mucus membranes or skin necrosis: No Has patient had a PCN reaction that required hospitalization No Has patient had a PCN reaction occurring within the last 10 years: No If all of the above answers are "NO", then may proceed with Cephalosporin use.     Social History  Socioeconomic History   Marital status: Married    Spouse name: Not on file   Number of children: Not on file   Years of education: Not on file   Highest education level: 12th grade  Occupational History   Not on file  Tobacco Use   Smoking status: Former    Packs/day: 1.50    Years: 5.00    Additional pack years: 0.00    Total pack years: 7.50    Types: Cigarettes   Smokeless tobacco: Never  Vaping Use   Vaping Use: Never used  Substance and Sexual Activity   Alcohol use: Yes    Comment: glass of wine or beer a day   Drug use: No   Sexual activity: Not on file  Other Topics Concern   Not on file  Social History  Narrative   Lives wife wife.  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    Social Determinants of Health   Financial Resource Strain: Low Risk  (09/02/2022)   Overall Financial Resource Strain (CARDIA)    Difficulty of Paying Living Expenses: Not hard at all  Food Insecurity: No Food Insecurity (09/02/2022)   Hunger Vital Sign    Worried About Running Out of Food in the Last Year: Never true    Ran Out of Food in the Last Year: Never true  Transportation Needs: No Transportation Needs (09/02/2022)   PRAPARE - Administrator, Civil Service (Medical): No    Lack of Transportation (Non-Medical): No  Physical Activity: Sufficiently Active (09/02/2022)   Exercise Vital Sign    Days of Exercise per Week: 7 days    Minutes of Exercise per Session: 30 min  Stress: No Stress Concern Present (09/02/2022)   [employer] of Occupational Health - Occupational Stress Questionnaire    Feeling of Stress : Not at all  Social Connections: Socially Integrated (09/02/2022)   Social Connection and Isolation Panel [NHANES]    Frequency of Communication with Friends and Family: More than three times a week    Frequency of Social Gatherings with Friends and Family: Not on file    Attends Religious Services: More than 4 times per year    Active Member of [employer] or Organizations: Yes    Attends Engineer, structural: More than 4 times per year    Marital Status: Married  Catering manager Violence: Not on file    Family History  Problem Relation Age of Onset   Diabetes Mother    Heart disease Mother    Stroke Mother    Clotting disorder Sister        Daughter also with clotting disorder.   Colon cancer Neg Hx    Esophageal cancer Neg Hx    Rectal cancer Neg Hx    Stomach cancer Neg Hx     Past Surgical History:  Procedure Laterality Date   COLONOSCOPY     15 years ago   HERNIA REPAIR     Inguinal     ROS: Review of Systems Negative except as stated  above  PHYSICAL EXAM: BP (!) 144/79 (BP Location: Left Arm, Patient Position: Sitting, Cuff Size: Normal)   Pulse 79   Temp 98.4 F (36.9 C) (Oral)   Ht 5\' 8"  (1.727 m)   Wt 186 lb (84.4 kg)   SpO2 99%   BMI 28.28 kg/m   Physical Exam   General appearance - alert, well appearing, older African-American male and in no distress Mental status -  normal mood, behavior, speech, dress, motor activity, and thought processes Musculoskeletal -patient has 1 crutch with him which he uses to ambulate.  Right knee: Moderately swollen.  No point tenderness.  Mild discomfort with flexion. Right foot/ankle: Mild edema of the ankle.  No erythema.  No point tenderness.  Good range of motion.  No edema or erythema of the ankle or foot. Left hand: Moderate edema with mild erythema and tenderness of the MCP joints of the index and middle finger.  He has good range of motion of the joints.   Ext: Trace edema of both lower extremities from below the knee down Skin -patient with hypopigmented changes noted on the dorsal surface of 3 of the toes on the right foot under second toe of the left foot.  Patient tells me that this started happening about 6 months ago.     Latest Ref Rng & Units 08/12/2022    1:43 PM 10/22/2021   11:29 AM 06/01/2021   12:34 PM  CMP  Glucose 70 - 99 mg/dL 96  87  57   BUN 8 - 27 mg/dL 11  13  15    Creatinine 0.76 - 1.27 mg/dL 1.61  0.96  0.45   Sodium 134 - 144 mmol/L 140  140  139   Potassium 3.5 - 5.2 mmol/L 4.7  4.5  4.1   Chloride 96 - 106 mmol/L 103  106  107   CO2 20 - 29 mmol/L 26  23  26    Calcium 8.6 - 10.2 mg/dL 40.9  81.1  91.4   Total Protein 6.0 - 8.5 g/dL  6.7    Total Bilirubin 0.0 - 1.2 mg/dL  0.6    Alkaline Phos 44 - 121 IU/L  54    AST 0 - 40 IU/L  17    ALT 0 - 44 IU/L  13     Lipid Panel     Component Value Date/Time   CHOL 161 10/22/2021 1129   TRIG 57 10/22/2021 1129   HDL 47 10/22/2021 1129   CHOLHDL 3.4 10/22/2021 1129   CHOLHDL 3.3 03/15/2014  1113   VLDL 15 03/15/2014 1113   LDLCALC 102 (H) 10/22/2021 1129   LDLDIRECT 106 (H) 05/05/2012 1032    CBC    Component Value Date/Time   WBC 5.8 08/12/2022 1343   WBC 6.8 06/01/2021 1234   RBC 4.68 08/12/2022 1343   RBC 4.98 06/01/2021 1234   HGB 13.0 08/12/2022 1343   HCT 38.1 08/12/2022 1343   PLT 260 08/12/2022 1343   MCV 81 08/12/2022 1343   MCH 27.8 08/12/2022 1343   MCH 28.3 06/01/2021 1234   MCHC 34.1 08/12/2022 1343   MCHC 33.1 06/01/2021 1234   RDW 12.6 08/12/2022 1343   LYMPHSABS 0.8 08/12/2022 1343   MONOABS 0.5 06/01/2021 1234   EOSABS 0.1 08/12/2022 1343   BASOSABS 0.0 08/12/2022 1343    ASSESSMENT AND PLAN:  1. Inflammatory arthritis I suspect this is gout.  This is the second similar attack he has had within the past month.  Recent RA factor was mildly elevated.  He has an upcoming appointment with rheumatology in October but has also requested to be placed on their cancellation list.  We will do a prednisone taper.  When he reaches the final dose of the prednisone, I recommend that he starts colchicine to help prevent future flares until he gets seen with the rheumatologist.  All questions were answered. - Uric Acid - predniSONE (DELTASONE) 20  MG tablet; 2 tabs daily x 2 days then 1.5 tab x 2 days then 1 tab x 3 days then 1/2 tab x 3 days  Dispense: 12 tablet; Refill: 0 - colchicine 0.6 MG tablet; 1 tab PO daily.  Start after finishing Prednisone taper  Dispense: 28 tablet; Refill: 0 - CBC With Diff/Platelet  2. Vitiligo Discussed diagnosis with him and printed information given.  Can be auto immune - Ambulatory referral to Dermatology  3. Essential hypertension I do not think his joint symptoms are related to Norvasc.  However the lower extremity edema is most likely due to the Norvasc.  I recommend that we change the medication.  Patient does not wish to change the medicine.  He would like to go back to taking the 5 mg instead of the 10 mg dose.  Informed  him that the 5 mg was not adequately controlling his blood pressure.  However patient insists on wanting to go back to the 5 mg dose for now and not change medication.  4. Edema of both legs See #3 above.    Patient was given the opportunity to ask questions.  Patient verbalized understanding of the plan and was able to repeat key elements of the plan.   Addendum 09/11/2022: Patient with mild normocytic anemia on CBC.  Will add iron studies.  This documentation was completed using Paediatric nurse.  Any transcriptional errors are unintentional.  No orders of the defined types were placed in this encounter.    Requested Prescriptions    No prescriptions requested or ordered in this encounter    No follow-ups on file.  Jonah Blue, MD, FACP

## 2022-09-11 LAB — CBC WITH DIFF/PLATELET
Basophils Absolute: 0 10*3/uL (ref 0.0–0.2)
Basos: 0 %
EOS (ABSOLUTE): 0.1 10*3/uL (ref 0.0–0.4)
Eos: 2 %
Hematocrit: 37.2 % — ABNORMAL LOW (ref 37.5–51.0)
Hemoglobin: 12.3 g/dL — ABNORMAL LOW (ref 13.0–17.7)
Immature Grans (Abs): 0 10*3/uL (ref 0.0–0.1)
Immature Granulocytes: 0 %
Lymphocytes Absolute: 0.7 10*3/uL (ref 0.7–3.1)
Lymphs: 13 %
MCH: 27.5 pg (ref 26.6–33.0)
MCHC: 33.1 g/dL (ref 31.5–35.7)
MCV: 83 fL (ref 79–97)
Monocytes Absolute: 0.4 10*3/uL (ref 0.1–0.9)
Monocytes: 8 %
Neutrophils Absolute: 4.2 10*3/uL (ref 1.4–7.0)
Neutrophils: 77 %
Platelets: 197 10*3/uL (ref 150–450)
RBC: 4.48 x10E6/uL (ref 4.14–5.80)
RDW: 13.5 % (ref 11.6–15.4)
WBC: 5.5 10*3/uL (ref 3.4–10.8)

## 2022-09-11 LAB — URIC ACID: Uric Acid: 4.1 mg/dL (ref 3.8–8.4)

## 2022-09-11 NOTE — Addendum Note (Signed)
Addended by: Jonah Blue B on: 09/11/2022 08:09 AM   Modules accepted: Orders

## 2022-09-12 ENCOUNTER — Other Ambulatory Visit: Payer: Self-pay | Admitting: Nurse Practitioner

## 2022-09-12 DIAGNOSIS — Z1211 Encounter for screening for malignant neoplasm of colon: Secondary | ICD-10-CM

## 2022-09-12 DIAGNOSIS — D509 Iron deficiency anemia, unspecified: Secondary | ICD-10-CM

## 2022-09-12 LAB — IRON AND TIBC
Iron: 23 ug/dL — ABNORMAL LOW (ref 38–169)
Total Iron Binding Capacity: 271 ug/dL (ref 250–450)

## 2022-09-25 LAB — FERRITIN: Ferritin: 203 ng/mL (ref 30–400)

## 2022-09-25 LAB — IRON AND TIBC
Iron Saturation: 8 % — CL (ref 15–55)
UIBC: 248 ug/dL (ref 111–343)

## 2022-09-25 LAB — SPECIMEN STATUS REPORT

## 2022-10-11 ENCOUNTER — Ambulatory Visit (HOSPITAL_COMMUNITY)
Admission: EM | Admit: 2022-10-11 | Discharge: 2022-10-11 | Disposition: A | Discharge status: Home / Self Care | Payer: No Typology Code available for payment source | Attending: Family Medicine | Admitting: Family Medicine

## 2022-10-11 ENCOUNTER — Encounter (HOSPITAL_COMMUNITY): Payer: Self-pay

## 2022-10-11 DIAGNOSIS — I10 Essential (primary) hypertension: Secondary | ICD-10-CM

## 2022-10-11 DIAGNOSIS — M25532 Pain in left wrist: Secondary | ICD-10-CM

## 2022-10-11 MED ORDER — PREDNISONE 10 MG (21) PO TBPK: ORAL_TABLET | Freq: Every day | ORAL | 0 refills | Status: DC

## 2022-10-11 NOTE — ED Triage Notes (Signed)
Patient here today with c/o left wrist/hand pain since Thursday. Patient lifted a pressure washer on Thursday and that evening his arms were sore. His right arm improved but his left wrist and hand have worsened. He states that his hand/wrist is sore and throbbing. It kept him up last night. He took IBU and Tylenol with no relief. He did states that his BP was low on Thursday and he did feel a little faint headed. He also takes Colchicine. His PCP thinks he may have gout but he doesn't think so.

## 2022-10-11 NOTE — Discharge Instructions (Addendum)
Your blood pressure was noted to be elevated during your visit today. If you are currently taking medication for high blood pressure, please ensure you are taking this as directed. If you do not have a history of high blood pressure and your blood pressure remains persistently elevated, you may need to begin taking a medication at some point. You may return here within the next few days to recheck if unable to see your primary care provider or if you do not have a one.  BP (!) 154/79 (BP Location: Right Arm)   Pulse 77   Temp 98.1 F (36.7 C) (Oral)   Resp 16   Ht 5\' 8"  (1.727 m)   Wt 81.6 kg   SpO2 97%   BMI 27.37 kg/m   BP Readings from Last 3 Encounters:  10/11/22 (!) 154/79  09/10/22 (!) 144/79  09/02/22 (!) 144/88

## 2022-10-11 NOTE — ED Provider Notes (Signed)
[facility]   [id number] 10/11/22 Arrival Time: 1003  ASSESSMENT & PLAN:  1. Left wrist pain   2. Elevated blood pressure reading in office with diagnosis of hypertension    No indication for plain imaging today. Inflammatory. Discussed.  New Prescriptions   PREDNISONE (STERAPRED UNI-PAK 21 TAB) 10 MG (21) TBPK TABLET    Take by mouth daily. Take as directed.   OTC ibuprofen.  Orders Placed This Encounter  Procedures   Apply Wrist brace  For comfort. To remove at times to work on ROM.  Work/school excuse note: not needed. Recommend:  Follow-up Information      [facility]   Why: If worsening or failing to improve as anticipated. Contact information: [address] [place] [address] [phone]        Marcine Matar, MD.   Specialty: Internal Medicine Why: To recheck your blood pressure. Contact information: [address] [place] [address] [phone]                   Discharge Instructions      Your blood pressure was noted to be elevated during your visit today. If you are currently taking medication for high blood pressure, please ensure you are taking this as directed. If you do not have a history of high blood pressure and your blood pressure remains persistently elevated, you may need to begin taking a medication at some point. You may return here within the next few days to recheck if unable to see your primary care provider or if you do not have a one.  BP (!) 154/79 (BP Location: Right Arm)   Pulse 77   Temp 98.1 F (36.7 C) (Oral)   Resp 16   Ht 5\' 8"  (1.727 m)   Wt 81.6 kg   SpO2 97%   BMI 27.37 kg/m   BP Readings from Last 3 Encounters:  10/11/22 (!) 154/79  09/10/22 (!) 144/79  09/02/22 (!) 144/88         Reviewed expectations re: course of current medical issues. Questions answered. Outlined signs and symptoms indicating need for more  acute intervention. Patient verbalized understanding. After Visit Summary given.  SUBJECTIVE: History from: patient. Leonard Bryant is a 72 y.o. male who reports LEFT wrist pain. Patient here today with c/o left wrist/hand pain since Thursday. Patient lifted a pressure washer on Thursday and that evening his arms were sore. His right arm improved but his left wrist and hand have worsened. He states that his hand/wrist is sore and throbbing. It kept him up last night. He took IBU and Tylenol with no relief. He did states that his BP was low on Thursday and he did feel a little faint headed. He also takes Colchicine. His PCP thinks he may have gout but he doesn't think so.  Denies current extremity sensation changes or weakness.   Past Surgical History:  Procedure Laterality Date   COLONOSCOPY     15 years ago   HERNIA REPAIR     Inguinal     Increased blood pressure noted today. He is treated for HTN. No symptoms.   OBJECTIVE:  Vitals:   10/11/22 1019  BP: (!) 154/79  Pulse: 77  Resp: 16  Temp: 98.1 F (36.7 C)  TempSrc: Oral  SpO2: 97%  Weight: 81.6 kg  Height: 5\' 8"  (1.727 m)    General appearance: alert; no distress HEENT: [place]; AT Neck:  supple with FROM Resp: unlabored respirations Extremities: LUE: warm with well perfused appearance; pain reported over ulnar side of wrist, more notable with wrist movement; no erythema or inflammation; no swelling; FROM; no specific bony tenderness CV: brisk extremity capillary refill of LUE; 2+ radial pulse of LUE. Skin: warm and dry; no visible rashes Neurologic: gait normal; normal sensation and strength of LUE  Allergies  Allergen Reactions   Pseudoephedrine Other (See Comments)    Testicular pain and tenderness   Penicillins Rash    Has patient had a PCN reaction causing immediate rash, facial/tongue/throat swelling, SOB or lightheadedness with hypotension: No Has patient had a PCN reaction causing severe rash involving mucus  membranes or skin necrosis: No Has patient had a PCN reaction that required hospitalization No Has patient had a PCN reaction occurring within the last 10 years: No If all of the above answers are "NO", then may proceed with Cephalosporin use.     Past Medical History:  Diagnosis Date   Arthritis    Possible arthritis in neck   Hypertension    Kidney stones    Social History   Socioeconomic History   Marital status: Married    Spouse name: Not on file   Number of children: Not on file   Years of education: Not on file   Highest education level: 12th grade  Occupational History   Not on file  Tobacco Use   Smoking status: Former    Current packs/day: 1.50    Average packs/day: 1.5 packs/day for 5.0 years (7.5 ttl pk-yrs)    Types: Cigarettes   Smokeless tobacco: Never  Vaping Use   Vaping status: Never Used  Substance and Sexual Activity   Alcohol use: Yes    Comment: glass of wine or beer a day   Drug use: No   Sexual activity: Not on file  Other Topics Concern   Not on file  Social History Narrative   Lives wife wife.  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    Social Determinants of Health   Financial Resource Strain: Low Risk  (09/02/2022)   Overall Financial Resource Strain (CARDIA)    Difficulty of Paying Living Expenses: Not hard at all  Food Insecurity: No Food Insecurity (09/02/2022)   Hunger Vital Sign    Worried About Running Out of Food in the Last Year: Never true    Ran Out of Food in the Last Year: Never true  Transportation Needs: No Transportation Needs (09/02/2022)   PRAPARE - Administrator, Civil Service (Medical): No    Lack of Transportation (Non-Medical): No  Physical Activity: Sufficiently Active (09/02/2022)   Exercise Vital Sign    Days of Exercise per Week: 7 days    Minutes of Exercise per Session: 30 min  Stress: No Stress Concern Present (09/02/2022)   [employer] of Occupational Health - Occupational  Stress Questionnaire    Feeling of Stress : Not at all  Social Connections: Socially Integrated (09/02/2022)   Social Connection and Isolation Panel [NHANES]    Frequency of Communication with Friends and Family: More than three times a week    Frequency of Social Gatherings with Friends and Family: Not on file    Attends Religious Services: More than 4 times per year    Active Member of [employer] or Organizations: Yes    Attends Engineer, structural: More than 4 times per year    Marital Status: Married   Family History  Problem Relation Age of Onset   Diabetes Mother    Heart disease Mother    Stroke Mother    Clotting disorder Sister        Daughter also with clotting disorder.   Colon cancer Neg Hx    Esophageal cancer Neg Hx    Rectal cancer Neg Hx    Stomach cancer Neg Hx    Past Surgical History:  Procedure Laterality Date   COLONOSCOPY     15 years ago   HERNIA REPAIR     Inguinal        Mardella Layman, MD 10/11/22 1047

## 2023-01-02 ENCOUNTER — Ambulatory Visit (INDEPENDENT_AMBULATORY_CARE_PROVIDER_SITE_OTHER): Payer: No Typology Code available for payment source

## 2023-01-02 ENCOUNTER — Ambulatory Visit: Payer: No Typology Code available for payment source

## 2023-01-02 ENCOUNTER — Encounter: Payer: Self-pay | Admitting: Internal Medicine

## 2023-01-02 ENCOUNTER — Ambulatory Visit: Payer: No Typology Code available for payment source | Attending: Internal Medicine | Admitting: Internal Medicine

## 2023-01-02 VITALS — BP 137/83 | HR 62 | Resp 15 | Ht 68.0 in | Wt 182.0 lb

## 2023-01-02 DIAGNOSIS — M79641 Pain in right hand: Secondary | ICD-10-CM

## 2023-01-02 DIAGNOSIS — R768 Other specified abnormal immunological findings in serum: Secondary | ICD-10-CM | POA: Insufficient documentation

## 2023-01-02 DIAGNOSIS — Z79899 Other long term (current) drug therapy: Secondary | ICD-10-CM | POA: Diagnosis not present

## 2023-01-02 DIAGNOSIS — M79642 Pain in left hand: Secondary | ICD-10-CM

## 2023-01-02 NOTE — Progress Notes (Signed)
Office Visit Note  Patient: Leonard Bryant             Date of Birth: April 13, 1950           MRN: [id number]             PCP: Marcine Matar, MD Referring: Georganna Skeans, MD Visit Date: 01/02/2023 Occupation: Military, mechanical, [employer]  Subjective:  New Patient (Initial Visit) (Gout? Abnormal labs)   History of Present Illness: Leonard Bryant is a 72 y.o. male here for evaluation of joint pain and swelling affecting multiple areas with positive rheumatoid factor.  Developed pain and swelling at his left wrist and fingers of left hand starting about 6 months ago.  Before this he did have a motorcycle ride to [place] and back but did not recall any out of the usual events or stress during that.  He was evaluated and took a 6-day prednisone taper that resolved his symptoms.  About 2 to 3 weeks later developed another episode of pain and swelling at the left hand and wrist which again resolved after a prednisone taper.  He had a third episode with this in May went to the urgent care for evaluation had some labs drawn that showed a positive rheumatoid factor of 53.6 subsequently went back to his primary office and was prescribed another course of oral prednisone that resolved symptoms.  Additional labs checked in June with a negative ANA and normal uric acid level also findings consistent for iron deficiency anemia.  He has found ibuprofen very effective for symptoms if taken preventatively or at the very outset of inflammation but once symptoms are worsening for more than a day or 2 this is ineffective and needed the steroid treatments.  Since then he is taking ibuprofen 800 mg about 3 times daily for almost 5 months now.  With this his symptoms have been pretty well-controlled mostly just getting aches and pains with use including muscle pain about 2 days after physical activities.  Currently has some aches with his shoulders and in the right elbow. Does not have any known family history of inflammatory or  autoimmune disease.  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  He also questions whether he had some rotator cuff problems sometimes getting pain with lifting activities but does not have a lot of baseline pain nighttime pain or decreased mobility.  Labs reviewed 08/2022 ANA neg Uric acid 4.1 Iron 23  07/2022 RF 53.6 ESR 11  05/2014 HCV neg  Activities of Daily Living:  Patient reports morning stiffness for 30 minutes.   Patient Denies nocturnal pain.  Difficulty dressing/grooming: Reports Difficulty climbing stairs: Denies Difficulty getting out of chair: Denies Difficulty using hands for taps, buttons, cutlery, and/or writing: Reports  Review of Systems  Constitutional:  Negative for fatigue.  HENT:  Negative for mouth sores and mouth dryness.   Eyes:  Negative for dryness.  Respiratory:  Negative for shortness of breath.   Cardiovascular:  Negative for chest pain and palpitations.  Gastrointestinal:  Negative for blood in stool, constipation and diarrhea.  Endocrine: Positive for increased urination.  Genitourinary:  Negative for involuntary urination.  Musculoskeletal:  Positive for joint pain, joint pain, joint swelling, myalgias, muscle weakness, morning stiffness, muscle tenderness and myalgias. Negative for gait problem.  Skin:  Positive for color change. Negative for rash, hair loss and sensitivity  to sunlight.  Allergic/Immunologic: Negative for susceptible to infections.  Neurological:  Negative for dizziness and headaches.  Hematological:  Negative for swollen glands.  Psychiatric/Behavioral:  Negative for depressed mood and sleep disturbance. The patient is not nervous/anxious.     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   Premature atrial contractions 10/01/2020   Aortic atherosclerosis (HCC) 10/01/2020   PVC (premature ventricular contraction) 06/28/2020   Floaters in visual field, right 06/28/2020   History of COVID-19 04/13/2020   Essential hypertension 07/06/2019   Refusal of blood transfusions as patient is Jehovah's Witness 05/23/2014   BPH (benign prostatic hyperplasia) 03/15/2014   Erectile dysfunction 03/15/2014   Leukopenia 05/06/2012   Allergic rhinitis 05/06/2012   Hyperlipidemia     Past Medical History:  Diagnosis Date   Arthritis    Possible arthritis in neck   Hypertension    Kidney stones     Family History  Problem Relation Age of Onset   Diabetes Mother    Heart disease Mother    Stroke Mother    Clotting disorder Sister        Daughter also with clotting disorder.   Colon cancer Neg Hx    Esophageal cancer Neg Hx    Rectal cancer Neg Hx    Stomach cancer Neg Hx    Past Surgical History:  Procedure Laterality Date   COLONOSCOPY     15 years ago   HERNIA REPAIR     Inguinal    Social History   Social History Narrative   Lives wife wife.  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    Immunization History  Administered Date(s) Administered   Moderna SARS-COV2 Booster Vaccination 05/03/2020   Moderna Sars-Covid-2 Vaccination 08/29/2019, 09/26/2019   Pfizer Covid-19 Vaccine Bivalent Booster 54yrs & up 12/05/2020   Pneumococcal Conjugate-13 12/30/2018   Tdap 03/15/2014     Objective: Vital Signs: BP 137/83 (BP Location: Left Arm, Patient Position: Sitting, Cuff Size: Normal)   Pulse 62   Resp 15   Ht 5\' 8"  (1.727 m)   Wt 182 lb (82.6 kg)   BMI 27.67 kg/m    Physical Exam Cardiovascular:     Rate and Rhythm: Normal rate and regular rhythm.  Pulmonary:     Effort: Pulmonary effort is normal.     Breath sounds: Normal breath sounds.  Skin:     General: Skin is warm and dry.     Findings: No rash.  Neurological:     Mental Status: He is alert.  Psychiatric:        Mood and Affect: Mood normal.      Musculoskeletal Exam:  Shoulders full ROM, mild tenderness to pressure on anterior side, some pain with internal rotation at horizontal position no palpable swelling Elbows mildly restricted extension range of motion on both sides with hard endpoints, right elbow has soreness and slight soft tissue swelling overlying lateral epicondyle Wrists full ROM no tenderness or swelling Fingers full ROM no tenderness or swelling, bony nodules at first IP joint both hands Knees full ROM no tenderness or swelling Ankles full ROM no tenderness or swelling   Investigation: No additional findings.  Imaging: XR Hand 2 View Left  Result Date: 01/02/2023 X-ray left hand 2 views  Radiocarpal joint appears normal.  Moderate degenerative change at the first [facility] joint with minimal subluxation.  Marginal osteophyte at first IP joint.  MCP and PIP joints appear normal.  Small lateral osteophyte at second and third DIPs.  No erosions seen.  Bone mineralization appears normal. 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  Degenerative change at first IP joint with prominent osteophyte.  MCP and PIP joints are intact.  Small lateral osteophytes or peritubular calcification at second and third DIPs.  No erosions seen.  Bone mineralization appears normal. Impression Mild distal joint osteoarthritis   Recent Labs: Lab Results  Component Value Date   WBC 5.5 09/10/2022   HGB 12.3 (L) 09/10/2022   PLT 197 09/10/2022   NA 140 08/12/2022   K 4.7 08/12/2022   CL 103 08/12/2022   CO2 26 08/12/2022   GLUCOSE 96 08/12/2022   BUN 11 08/12/2022   CREATININE 0.78 08/12/2022   BILITOT 0.6 10/22/2021    ALKPHOS 54 10/22/2021   AST 17 10/22/2021   ALT 13 10/22/2021   PROT 6.7 10/22/2021   ALBUMIN 4.4 10/22/2021   CALCIUM 10.5 (H) 08/12/2022   GFRAA 86 12/30/2018    Speciality Comments: No specialty comments available.  Procedures:  No procedures performed Allergies: Pseudoephedrine and Penicillins   Assessment / Plan:     Visit Diagnoses: Rheumatoid factor positive - Plan: Sedimentation rate, C-reactive protein, Cyclic citrul peptide antibody, IgG  Positive rheumatoid factor plus symptoms sound consistent with episodes of inflammatory arthritis although there is no peripheral joint inflammation appreciable on exam today.  Will check sed rate and CRP also checking for CCP antibody.  Currently on a lot of ibuprofen reports 800 mg 3 times daily maintenance.  If lab results are unremarkable I would recommend a trial of stopping his current high dose daily NSAIDs before our follow-up to repeat the examination with possible musculoskeletal ultrasound to better assess disease activity.  Bilateral hand pain - Plan: XR Hand 2 View Right, XR Hand 2 View Left  Some degenerative change visible at the first IP joint on both hands.  X-rays are consistent with this osteoarthritis also some involvement at base of the left thumb there is no wrist abnormality to account for the reported swelling.  No evidence of erosive disease.  High risk medication use - Plan: COMPLETE METABOLIC PANEL WITH GFR, Hepatitis B core antibody, IgM, Hepatitis B surface antigen  Checking CMP for monitoring with prolonged high-dose NSAIDs also baseline considering possible DMARD medication.  Previous negative hepatitis C screening reviewed will check a hepatitis B screening today.  Orders: Orders Placed This Encounter  Procedures   XR Hand 2 View Right   XR Hand 2 View Left   Sedimentation rate   C-reactive protein   Cyclic citrul peptide antibody, IgG   COMPLETE METABOLIC PANEL WITH GFR   Hepatitis B core antibody, IgM    Hepatitis B surface antigen   No orders of the defined types were placed in this encounter.    Follow-Up Instructions: Return in about 4 weeks (around 01/30/2023) for OA/?RA f/u 40mo.   Fuller Plan, MD  Note - This record has been created using [employer].  Chart creation errors have been sought, but may not always  have been located. Such creation errors do not reflect on  the standard of medical care.

## 2023-01-03 ENCOUNTER — Emergency Department (HOSPITAL_COMMUNITY): Payer: No Typology Code available for payment source

## 2023-01-03 ENCOUNTER — Emergency Department (HOSPITAL_COMMUNITY)
Admission: EM | Admit: 2023-01-03 | Discharge: 2023-01-04 | Disposition: A | Discharge status: Home / Self Care | Payer: No Typology Code available for payment source | Attending: Emergency Medicine | Admitting: Emergency Medicine

## 2023-01-03 ENCOUNTER — Other Ambulatory Visit: Payer: Self-pay

## 2023-01-03 ENCOUNTER — Encounter (HOSPITAL_COMMUNITY): Payer: Self-pay | Admitting: Emergency Medicine

## 2023-01-03 DIAGNOSIS — M79642 Pain in left hand: Secondary | ICD-10-CM | POA: Insufficient documentation

## 2023-01-03 DIAGNOSIS — I10 Essential (primary) hypertension: Secondary | ICD-10-CM | POA: Insufficient documentation

## 2023-01-03 DIAGNOSIS — Z7982 Long term (current) use of aspirin: Secondary | ICD-10-CM | POA: Diagnosis not present

## 2023-01-03 DIAGNOSIS — Z79899 Other long term (current) drug therapy: Secondary | ICD-10-CM | POA: Insufficient documentation

## 2023-01-03 NOTE — ED Triage Notes (Signed)
Pt c/o left hand pain and left wrist pain since putting tool boxes on his truck yesterday. States he saw rheumatology yesterday, thinks it could be RA or OA. States that this pain occurs occasionally.

## 2023-01-04 MED ORDER — PREDNISONE 10 MG (21) PO TBPK: ORAL_TABLET | Freq: Every day | ORAL | 0 refills | Status: DC

## 2023-01-04 MED ORDER — DEXAMETHASONE SODIUM PHOSPHATE 10 MG/ML IJ SOLN
10.0000 mg | Freq: Once | INTRAMUSCULAR | Status: AC
  Administered 2023-01-04: 10 mg via INTRAMUSCULAR
  Filled 2023-01-04: qty 1

## 2023-01-04 NOTE — Discharge Instructions (Addendum)
You were evaluated today for hand pain.  You have been prescribed a steroid.  Please take as directed.  Follow-up with primary care and rheumatology as needed.  If you develop any life-threatening symptoms please return to the emergency department.

## 2023-01-04 NOTE — ED Provider Notes (Signed)
[place] EMERGENCY DEPARTMENT AT [facility] Provider Note   CSN: [id number] Arrival date & time: 01/03/23  1945     History  Chief Complaint  Patient presents with   Hand Pain    Leonard Bryant is a 72 y.o. male.  Patient presents to the ED with complaint of left wrist and hand pain. Has history of similar pain and currently being worked up by rheumatology for possible osteoarthritis vs other inflammatory disease. Last evaluated by rheumatology yesterday which included images of the wrist/hand and labs. Patient currently complaining of pain predominantly in the left wrist, worsened after lifting toolboxes yesterday. Endorses associated stiffness in the wrist and hand. Has taken 16 pills of ibuprofen today without relief of symptoms. Requesting medication for pain control.    Hand Pain       Home Medications Prior to Admission medications   Medication Sig Start Date End Date Taking? Authorizing Provider  predniSONE (STERAPRED UNI-PAK 21 TAB) 10 MG (21) TBPK tablet Take by mouth daily. Take 6 tabs by mouth daily  for 2 days, then 5 tabs for 2 days, then 4 tabs for 2 days, then 3 tabs for 2 days, 2 tabs for 2 days, then 1 tab by mouth daily for 2 days 01/04/23  Yes Barrie Dunker B, PA-C  amLODipine (NORVASC) 10 MG tablet Take 1 tablet (10 mg total) by mouth daily. Patient taking differently: Take 10 mg by mouth daily. Patient takes 1/2 pill daily 04/29/22   Marcine Matar, MD  aspirin EC 81 MG tablet Take 81 mg by mouth daily.    [provider]  colchicine 0.6 MG tablet 1 tab PO daily.  Start after finishing Prednisone taper Patient not taking: Reported on 01/02/2023 09/10/22   Marcine Matar, MD  fluticasone [facility]) 50 MCG/ACT nasal spray Place 1 spray into both nostrils daily. 09/02/22   Marcine Matar, MD  IBUPROFEN PO Take by mouth.    [provider]  Multiple Vitamin (MULTIVITAMIN) tablet Take 1 tablet by mouth daily.    [provider]  tamsulosin (FLOMAX) 0.4 MG CAPS capsule Take 0.4 mg by mouth.    [provider]  Travoprost, BAK Free, (TRAVATAN) 0.004 % SOLN ophthalmic solution INSTILL 1 DROP INTO EACH EYE ONCE DAILY AT NIGHT 05/04/21   [provider]      Allergies    Pseudoephedrine and Penicillins    Review of Systems   Review of Systems  Physical Exam Updated Vital Signs BP (!) 191/92   Pulse 79   Temp 98.5 F (36.9 C) (Oral)   Resp 18   Ht 5\' 8"  (1.727 m)   Wt 81.6 kg   SpO2 100%   BMI 27.35 kg/m  Physical Exam HENT:     Head: Normocephalic and atraumatic.  Eyes:     Pupils: Pupils are equal, round, and reactive to light.  Pulmonary:     Effort: Pulmonary effort is normal. No respiratory distress.  Musculoskeletal:        General: No signs of injury.     Cervical back: Normal range of motion.     Comments: Wrists full ROM no tenderness or swelling. Moderate swelling noted to left hand near thumb joint.  Fingers full ROM no tenderness or swelling, bony nodules at first IP joint both hands  Skin:    General: Skin is dry.  Neurological:     Mental Status: He is alert.  Psychiatric:  Speech: Speech normal.        Behavior: Behavior normal.     ED Results / Procedures / Treatments   Labs (all labs ordered are listed, but only abnormal results are displayed) Labs Reviewed - No data to display  EKG None  Radiology XR Hand 2 View Left  Result Date: 01/02/2023 X-ray left hand 2 views Radiocarpal joint appears normal.  Moderate degenerative change at the first [facility] joint with minimal subluxation.  Marginal osteophyte at first IP joint.  MCP and PIP joints appear normal.  Small lateral osteophyte at second and third DIPs.  No erosions seen.  Bone mineralization appears normal. 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  Degenerative change at first IP joint with prominent osteophyte.  MCP and PIP joints are intact.  Small lateral osteophytes or peritubular calcification at second and third DIPs.  No erosions seen.  Bone mineralization appears normal. Impression Mild distal joint osteoarthritis   Procedures Procedures    Medications Ordered in ED Medications  dexamethasone (DECADRON) injection 10 mg (10 mg Intramuscular Given 01/04/23 0051)    ED Course/ Medical Decision Making/ A&P                                 Medical Decision Making Risk Prescription drug management.   This patient presents to the ED for concern of left hand swelling, this involves an extensive number of treatment options, and is a complaint that carries with it a high risk of complications and morbidity.  The differential diagnosis includes RA, OA, septic joint, others   Co morbidities that complicate the patient evaluation  Positive RA factor, arthritis, hypertension   Additional history obtained:   External records from outside source obtained and reviewed including rheumatology notes from yesterday   Imaging Studies ordered:  I reviewed imaging results of bilateral hands from yesterday.  No acute fracture or dislocation.  Mild degenerative changes noted.   Problem List / ED Course / Critical interventions / Medication management   I ordered medication including Decadron for inflammation Reevaluation of the patient after these medicines showed that the patient improved I have reviewed the patients home medicines and have made adjustments as needed   Test / Admission - Considered:  Patient currently being followed by rheumatology for the same complaint.  Rheumatology is doing a full workup for the patient's arthritis to determine whether this is autoimmune versus possible osteoarthritis.  The patient is currently taking a maximum dose of ibuprofen at home.  I do not feel that  further anti-inflammatories would be prudent due to concerns about kidney function.  The patient has responded favorably in the past to Decadron and prednisone tapers.  This seems very reasonable and I will prescribe a prednisone taper to be taken at home.  Patient plans to follow-up with rheumatology for further evaluation.  Patient has had this presentation multiple times in the past and this is consistent with his presentations.  No concern at this time for septic arthritis.  No fever, no significant warmth or surrounding erythema.         Final Clinical Impression(s) / ED Diagnoses Final diagnoses:  Hand pain, left    Rx / DC Orders ED Discharge Orders          Ordered  predniSONE (STERAPRED UNI-PAK 21 TAB) 10 MG (21) TBPK tablet  Daily        01/04/23 0059              Darrick Grinder, PA-C 01/04/23 0059    Nira Conn, MD 01/04/23 1932

## 2023-01-06 LAB — COMPLETE METABOLIC PANEL WITH GFR
AG Ratio: 2.1 (calc) (ref 1.0–2.5)
ALT: 17 U/L (ref 9–46)
AST: 20 U/L (ref 10–35)
Albumin: 4.7 g/dL (ref 3.6–5.1)
Alkaline phosphatase (APISO): 68 U/L (ref 35–144)
BUN: 18 mg/dL (ref 7–25)
CO2: 27 mmol/L (ref 20–32)
Calcium: 10.4 mg/dL — ABNORMAL HIGH (ref 8.6–10.3)
Chloride: 107 mmol/L (ref 98–110)
Creat: 1.05 mg/dL (ref 0.70–1.28)
Globulin: 2.2 g/dL (ref 1.9–3.7)
Glucose, Bld: 103 mg/dL — ABNORMAL HIGH (ref 65–99)
Potassium: 4.5 mmol/L (ref 3.5–5.3)
Sodium: 140 mmol/L (ref 135–146)
Total Bilirubin: 0.4 mg/dL (ref 0.2–1.2)
Total Protein: 6.9 g/dL (ref 6.1–8.1)
eGFR: 76 mL/min/{1.73_m2} (ref >=60)

## 2023-01-06 LAB — C-REACTIVE PROTEIN: CRP: <3 mg/L (ref <8.0)

## 2023-01-06 LAB — HEPATITIS B CORE ANTIBODY, IGM: Hep B C IgM: NONREACTIVE

## 2023-01-06 LAB — SEDIMENTATION RATE: Sed Rate: 2 mm/h (ref 0–20)

## 2023-01-06 LAB — CYCLIC CITRUL PEPTIDE ANTIBODY, IGG: Cyclic Citrullin Peptide Ab: >250 U — ABNORMAL HIGH

## 2023-01-06 LAB — HEPATITIS B SURFACE ANTIGEN: Hepatitis B Surface Ag: NONREACTIVE

## 2023-01-19 DIAGNOSIS — N401 Enlarged prostate with lower urinary tract symptoms: Secondary | ICD-10-CM | POA: Diagnosis not present

## 2023-01-19 DIAGNOSIS — Z008 Encounter for other general examination: Secondary | ICD-10-CM | POA: Diagnosis not present

## 2023-01-19 DIAGNOSIS — Z6827 Body mass index (BMI) 27.0-27.9, adult: Secondary | ICD-10-CM | POA: Diagnosis not present

## 2023-01-19 DIAGNOSIS — F17211 Nicotine dependence, cigarettes, in remission: Secondary | ICD-10-CM | POA: Diagnosis not present

## 2023-01-19 DIAGNOSIS — I700 Atherosclerosis of aorta: Secondary | ICD-10-CM | POA: Diagnosis not present

## 2023-01-19 DIAGNOSIS — E663 Overweight: Secondary | ICD-10-CM | POA: Diagnosis not present

## 2023-01-19 DIAGNOSIS — H4010X0 Unspecified open-angle glaucoma, stage unspecified: Secondary | ICD-10-CM | POA: Diagnosis not present

## 2023-01-20 DIAGNOSIS — Z0100 Encounter for examination of eyes and vision without abnormal findings: Secondary | ICD-10-CM | POA: Diagnosis not present

## 2023-02-02 ENCOUNTER — Encounter: Payer: Self-pay | Admitting: Internal Medicine

## 2023-02-02 ENCOUNTER — Ambulatory Visit: Payer: No Typology Code available for payment source | Attending: Internal Medicine | Admitting: Internal Medicine

## 2023-02-02 VITALS — BP 133/77 | HR 69 | Resp 14 | Ht 68.0 in | Wt 183.0 lb

## 2023-02-02 VITALS — BP 136/84 | HR 62 | Wt 185.4 lb

## 2023-02-02 DIAGNOSIS — R768 Other specified abnormal immunological findings in serum: Secondary | ICD-10-CM

## 2023-02-02 DIAGNOSIS — M130 Polyarthritis, unspecified: Secondary | ICD-10-CM | POA: Diagnosis not present

## 2023-02-02 DIAGNOSIS — I10 Essential (primary) hypertension: Secondary | ICD-10-CM

## 2023-02-02 DIAGNOSIS — J302 Other seasonal allergic rhinitis: Secondary | ICD-10-CM

## 2023-02-02 DIAGNOSIS — Z2821 Immunization not carried out because of patient refusal: Secondary | ICD-10-CM

## 2023-02-02 DIAGNOSIS — Z79899 Other long term (current) drug therapy: Secondary | ICD-10-CM

## 2023-02-02 DIAGNOSIS — Z1211 Encounter for screening for malignant neoplasm of colon: Secondary | ICD-10-CM

## 2023-02-02 MED ORDER — METHOTREXATE SODIUM 2.5 MG PO TABS
15.0000 mg | ORAL_TABLET | ORAL | 0 refills | Status: DC
Start: 2023-02-02 — End: 2023-03-17

## 2023-02-02 MED ORDER — FOLIC ACID 1 MG PO TABS
1.0000 mg | ORAL_TABLET | Freq: Every day | ORAL | 0 refills | Status: DC
Start: 2023-02-02 — End: 2023-03-17

## 2023-02-02 MED ORDER — FLUTICASONE PROPIONATE 50 MCG/ACT NA SUSP
1.0000 | Freq: Every day | NASAL | 1 refills | Status: DC
Start: 2023-02-02 — End: 2023-06-02

## 2023-02-02 NOTE — Patient Instructions (Signed)
Methotrexate Tablets What is this medication? METHOTREXATE (METH oh TREX ate) treats autoimmune conditions, such as arthritis and psoriasis. It works by decreasing inflammation, which can reduce pain and prevent long-term injury to the joints and skin. It may also be used to treat some types of cancer. It works by slowing down the growth of cancer cells. This medicine may be used for other purposes; ask your health care provider or pharmacist if you have questions. COMMON BRAND NAME(S): Rheumatrex, Trexall What should I tell my care team before I take this medication? They need to know if you have any of these conditions: Dehydration Diabetes Fluid in the stomach area or lungs Frequently drink alcohol Having surgery, including dental surgery High cholesterol Immune system problems Inflammatory bowel disease, such as ulcerative colitis Kidney disease Liver disease Low blood cell levels (white cells, red cells, and platelets) Lung disease Recent or ongoing radiation Recent or upcoming vaccine Stomach ulcers, other stomach or intestine problems An unusual or allergic reaction to methotrexate, other medications, foods, dyes, or preservatives Pregnant or trying to get pregnant Breastfeeding How should I use this medication? Take this medication by mouth with water. Take it as directed on the prescription label. Do not take extra. Keep taking this medication until your care team tells you to stop. Know why you are taking this medication and how you should take it. To treat conditions such as arthritis and psoriasis, this medication is taken ONCE A WEEK as a single dose or divided into 3 smaller doses taken 12 hours apart (do not take more than 3 doses 12 hours apart each week). This medication is NEVER taken daily to treat conditions other than cancer. Taking this medication more often than directed can cause serious side effects, even death. Talk to your care team about why you are taking this  medication, how often you will take it, and what your dose is. Ask your care team to put the reason you take this medication on the prescription. If you take this medication ONCE A WEEK, choose a day of the week before you start. Ask your pharmacist to include the day of the week on the label. Avoid "Monday", which could be misread as "Morning". Handling this medication may be harmful. Talk to your care team about how to handle this medication. Special instructions may apply. Talk to your care team about the use of this medication in children. While it may be prescribed for selected conditions, precautions do apply. Overdosage: If you think you have taken too much of this medicine contact a poison control center or emergency room at once. NOTE: This medicine is only for you. Do not share this medicine with others. What if I miss a dose? If you miss a dose, talk with your care team. Do not take double or extra doses. What may interact with this medication? Do not take this medication with any of the following: Acitretin Live virus vaccines Probenecid This medication may also interact with the following: Alcohol Aspirin and aspirin-like medications Certain antibiotics, such as penicillin, neomycin, sulfamethoxazole; trimethoprim Certain medications for stomach problems, such as lansoprazole, omeprazole, pantoprazole Clozapine Cyclosporine Dapsone Folic acid Foscarnet NSAIDs, medications for pain and inflammation, such as ibuprofen or naproxen Phenytoin Pyrimethamine Steroid medications, such as prednisone or cortisone Tacrolimus Theophylline This list may not describe all possible interactions. Give your health care provider a list of all the medicines, herbs, non-prescription drugs, or dietary supplements you use. Also tell them if you smoke, drink alcohol, or use  illegal drugs. Some items may interact with your medicine. What should I watch for while using this medication? Visit your  care team for regular checks on your progress. It may be some time before you see the benefit from this medication. You may need blood work done while you are taking this medication. If your care team has also prescribed folic acid, they may instruct you to skip your folic acid dose on the day you take methotrexate. This medication can make you more sensitive to the sun. Keep out of the sun. If you cannot avoid being in the sun, wear protective clothing and sunscreen. Do not use sun lamps, tanning beds, or tanning booths. Check with your care team if you have severe diarrhea, nausea, and vomiting, or if you sweat a lot. The loss of too much body fluid may make it dangerous for you to take this medication. This medication may increase your risk of getting an infection. Call your care team for advice if you get a fever, chills, sore throat, or other symptoms of a cold or flu. Do not treat yourself. Try to avoid being around people who are sick. Talk to your care team about your risk of cancer. You may be more at risk for certain types of cancers if you take this medication. Talk to your care team if you or your partner may be pregnant. Serious birth defects can occur if you take this medication during pregnancy and for 6 months after the last dose. You will need a negative pregnancy test before starting this medication. Contraception is recommended while taking this medication and for 6 months after the last dose. Your care team can help you find the option that works for you. If your partner can get pregnant, use a condom during sex while taking this medication and for 3 months after the last dose. Do not breastfeed while taking this medication and for 1 week after the last dose. This medication may cause infertility. Talk to your care team if you are concerned about your fertility. What side effects may I notice from receiving this medication? Side effects that you should report to your care team as soon  as possible: Allergic reactions--skin rash, itching, hives, swelling of the face, lips, tongue, or throat Dry cough, shortness of breath or trouble breathing Infection--fever, chills, cough, sore throat, wounds that don't heal, pain or trouble when passing urine, general feeling of discomfort or being unwell Kidney injury--decrease in the amount of urine, swelling of the ankles, hands, or feet Liver injury--right upper belly pain, loss of appetite, nausea, light-colored stool, dark yellow or brown urine, yellowing skin or eyes, unusual weakness or fatigue Low red blood cell level--unusual weakness or fatigue, dizziness, headache, trouble breathing Pain, tingling, or numbness in the hands or feet, muscle weakness, change in vision, confusion or trouble speaking, loss of balance or coordination, trouble walking, seizures Redness, blistering, peeling, or loosening of the skin, including inside the mouth Stomach bleeding--bloody or black, tar-like stools, vomiting blood or brown material that looks like coffee grounds Stomach pain that is severe, does not away, or gets worse Unusual bruising or bleeding Side effects that usually do not require medical attention (report these to your care team if they continue or are bothersome): Diarrhea Dizziness Hair loss Nausea Pain, redness, or swelling with sores inside the mouth or throat Skin reactions on sun-exposed areas Vomiting This list may not describe all possible side effects. Call your doctor for medical advice about side effects. You  may report side effects to FDA at 1-800-FDA-1088. Where should I keep my medication? Keep out of the reach of children and pets. Store at room temperature between 20 and 25 degrees C (68 and 77 degrees F). Protect from light. Keep the container tightly closed. Get rid of any unused medication after the expiration date. To get rid of medications that are no longer needed or have expired: Take the medication to a  medication take-back program. Check with your pharmacy or law enforcement to find a location. If you cannot return the medication, ask your pharmacist or care team how to get rid of this medication safely. NOTE: This sheet is a summary. It may not cover all possible information. If you have questions about this medicine, talk to your doctor, pharmacist, or health care provider.  2024 [employer]vier/Gold Standard (2022-04-08 00:00:00)

## 2023-02-02 NOTE — Progress Notes (Signed)
Office Visit Note  Patient: Leonard Bryant             Date of Birth: 1950/12/09           MRN: [id number]             PCP: Marcine Matar, MD Referring: Marcine Matar, MD Visit Date: 02/02/2023   Subjective:  Follow-up (Patient states he has not taken ibuprofen yesterday or today. Patient states his left wrist is not hurting as bad today. )   History of Present Illness: Leonard Bryant is a 72 y.o. male here for follow up for seropositive rheumatoid arthritis.  Workup at our initial visit was somewhat nonspecific with normal serum inflammatory markers and x-rays showing degenerative arthritis changes but highly positive CCP antibody.  About 2 days after that visit he had to go to the emergency department due to severe left wrist pain and swelling.  This was treated with a 21 tablet prednisone taper pack and symptoms improved.  Currently he is off any medication and symptoms are doing very well today  Previous HPI 01/02/23 Leonard Bryant is a 72 y.o. male here for evaluation of joint pain and swelling affecting multiple areas with positive rheumatoid factor.  Developed pain and swelling at his left wrist and fingers of left hand starting about 6 months ago.  Before this he did have a motorcycle ride to [place] and back but did not recall any out of the usual events or stress during that.  He was evaluated and took a 6-day prednisone taper that resolved his symptoms.  About 2 to 3 weeks later developed another episode of pain and swelling at the left hand and wrist which again resolved after a prednisone taper.  He had a third episode with this in May went to the urgent care for evaluation had some labs drawn that showed a positive rheumatoid factor of 53.6 subsequently went back to his primary office and was prescribed another course of oral prednisone that resolved symptoms.  Additional labs checked in June with a negative ANA and normal uric acid level also findings consistent for iron deficiency  anemia.  He has found ibuprofen very effective for symptoms if taken preventatively or at the very outset of inflammation but once symptoms are worsening for more than a day or 2 this is ineffective and needed the steroid treatments.  Since then he is taking ibuprofen 800 mg about 3 times daily for almost 5 months now.  With this his symptoms have been pretty well-controlled mostly just getting aches and pains with use including muscle pain about 2 days after physical activities.  Currently has some aches with his shoulders and in the right elbow. Does not have any known family history of inflammatory or autoimmune disease.  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  He also questions whether he had some rotator cuff problems sometimes getting pain with lifting activities but does not have a lot of baseline pain nighttime pain or decreased mobility.   Labs reviewed 08/2022 ANA neg Uric acid 4.1 Iron 23   07/2022 RF 53.6 ESR 11   05/2014 HCV neg   Review of Systems  Constitutional:  Negative for fatigue.  HENT:  Negative for mouth sores and mouth dryness.   Eyes:  Negative for dryness.  Respiratory:  Negative for shortness of breath.  Cardiovascular:  Negative for chest pain and palpitations.  Gastrointestinal:  Negative for blood in stool, constipation and diarrhea.  Endocrine: Negative for increased urination.  Genitourinary:  Negative for involuntary urination.  Musculoskeletal:  Positive for joint pain, joint pain, joint swelling and muscle tenderness. Negative for gait problem, myalgias, muscle weakness, morning stiffness and myalgias.  Skin:  Positive for color change. Negative for rash, hair loss and sensitivity to sunlight.  Allergic/Immunologic: Negative for susceptible to infections.  Neurological:  Negative for dizziness and headaches.  Hematological:  Negative for swollen  glands.  Psychiatric/Behavioral:  Negative for depressed mood and sleep disturbance. The patient is not nervous/anxious.     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   Premature atrial contractions 10/01/2020   Aortic atherosclerosis (HCC) 10/01/2020   PVC (premature ventricular contraction) 06/28/2020   Floaters in visual field, right 06/28/2020   History of COVID-19 04/13/2020   Essential hypertension 07/06/2019   Refusal of blood transfusions as patient is Jehovah's Witness 05/23/2014   BPH (benign prostatic hyperplasia) 03/15/2014   Erectile dysfunction 03/15/2014   Leukopenia 05/06/2012   Allergic rhinitis 05/06/2012   Hyperlipidemia     Past Medical History:  Diagnosis Date   Arthritis    Possible arthritis in neck   Hypertension    Kidney stones     Family History  Problem Relation Age of Onset   Diabetes Mother    Heart disease Mother    Stroke Mother    Clotting disorder Sister        Daughter also with clotting disorder.   Colon cancer Neg Hx    Esophageal cancer Neg Hx    Rectal cancer Neg Hx    Stomach cancer Neg Hx    Past Surgical History:  Procedure Laterality Date   COLONOSCOPY     15 years ago   HERNIA REPAIR     Inguinal    Social History   Social History Narrative   Lives wife wife.  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    Immunization History  Administered Date(s) Administered   Influenza-Unspecified 01/28/2023   Moderna SARS-COV2 Booster Vaccination 05/03/2020   Moderna Sars-Covid-2 Vaccination 08/29/2019, 09/26/2019   Pfizer Covid-19 Vaccine Bivalent Booster 64yrs & up 12/05/2020   Pneumococcal Conjugate-13 12/30/2018   Tdap 03/15/2014     Objective: Vital Signs: BP 133/77 (BP Location:  Left Arm, Patient Position: Sitting, Cuff Size: Normal)   Pulse 69   Resp 14   Ht 5\' 8"  (1.727 m)   Wt 183 lb (83 kg)   BMI 27.83 kg/m    Physical Exam Eyes:     Conjunctiva/sclera: Conjunctivae normal.  Cardiovascular:     Rate and Rhythm: Normal rate and regular rhythm.  Pulmonary:     Effort: Pulmonary effort is normal.     Breath sounds: Normal breath sounds.  Lymphadenopathy:     Cervical: No cervical adenopathy.  Skin:    General: Skin is warm and dry.     Findings: No rash.  Neurological:     Mental Status: He is alert.  Psychiatric:        Mood and Affect: Mood normal.      Musculoskeletal Exam:  Shoulders full ROM no tenderness or swelling Elbows slightly restricted  ROM, no tenderness or swelling Wrists full ROM no tenderness or swelling Fingers full ROM no tenderness or swelling Knees full ROM no tenderness or swelling   Investigation: No additional findings.  Imaging: No results found.  Recent Labs: Lab Results  Component Value Date   WBC 5.5 09/10/2022   HGB 12.3 (L) 09/10/2022   PLT 197 09/10/2022   NA 140 01/02/2023   K 4.5 01/02/2023   CL 107 01/02/2023   CO2 27 01/02/2023   GLUCOSE 103 (H) 01/02/2023   BUN 18 01/02/2023   CREATININE 1.05 01/02/2023   BILITOT 0.4 01/02/2023   ALKPHOS 54 10/22/2021   AST 20 01/02/2023   ALT 17 01/02/2023   PROT 6.9 01/02/2023   ALBUMIN 4.4 10/22/2021   CALCIUM 10.4 (H) 01/02/2023   GFRAA 86 12/30/2018    Speciality Comments: No specialty comments available.  Procedures:  No procedures performed Allergies: Pseudoephedrine and Penicillins   Assessment / Plan:     Visit Diagnoses: Rheumatoid factor positive - Plan: methotrexate (RHEUMATREX) 2.5 MG tablet, folic acid (FOLVITE) 1 MG tablet  Results and clinical picture appear consistent with seropositive rheumatoid arthritis although I still have not seen him during an actual flareup to confirm peripheral joint synovitis.  Discussed options including  more conservative approach with NSAIDs and monitoring, pursuing MRI of the left wrist to rule out structural causes and/or confirm chronic inflammatory changes to the joint, or start treatment with DMARDs.  Plan to start methotrexate 15 mg p.o. weekly and folic acid 1 mg daily.  High risk medication use  Reviewed risks of methotrexate treatment including drug reactions including mucositis or pneumonitis, cytopenias, hepatotoxicity, and need for laboratory monitoring.  Discussed limiting alcohol consumption.  Discussed possible interaction with oral NSAIDs taken as needed but should not be a major consideration starting at low-dose.  Orders: No orders of the defined types were placed in this encounter.  Meds ordered this encounter  Medications   methotrexate (RHEUMATREX) 2.5 MG tablet    Sig: Take 6 tablets (15 mg total) by mouth once a week. Caution:Chemotherapy. Protect from light.    Dispense:  30 tablet    Refill:  0   folic acid (FOLVITE) 1 MG tablet    Sig: Take 1 tablet (1 mg total) by mouth daily.    Dispense:  90 tablet    Refill:  0   Greater than 30 minutes in total were spent today in combination of repeat detailed physical exam, patient counseling of findings and multiple treatment options available, and ordering and documentation.  Follow-Up Instructions: Return in about 4 weeks (around 03/02/2023) for RA MTX start f/u ~4wks.   Fuller Plan, MD  Note - This record has been created using [employer].  Chart creation errors have been sought, but may not always  have been located. Such creation errors do not reflect on  the standard of medical care.

## 2023-02-02 NOTE — Patient Instructions (Signed)
Please remember to schedule your colonoscopy at a later date at your convenience.

## 2023-02-02 NOTE — Progress Notes (Signed)
Patient ID: Leonard Bryant, male    DOB: 08-04-1950  MRN: [id number]  CC: Medical Management of Chronic Issues and Medication Refill   Subjective: Leonard Bryant is a 72 y.o. male who presents for chronic ds management. His concerns today include:  Pt with hx of HTN, vitiligo, glaucoma, BPH, mild aortic atherosclerosis declines statin, dec hearing, polyarthritis  Since last visit with me, he did get in with the rheumatologist Dr. Dimple Casey.  He is being worked up for inflammatory arthritis.  Recent labs showed elevated CCP.  X-rays of his hands showed osteoarthritis changes.  Patient was taking ibuprofen 800 mg 3 times a day. Pt states he has cut back to once a day.  He was advised by Dr. Dimple Casey to discontinue so he can get clear pic of what is going on.  Has f/u appt tomorrow.  HTN:  on last visit, pt insisted on decreasing Norvasc to 5 mg.  Took med this a.m.  Checks BP 3x/wk last few wks.  Range 117/74-121/70s.  He endorses higher levels in the 140s when he was taking Ibuprofen. Most recent GFR 76. Limit salt  Did received call from derm for vitiligo but decided to put off because he had other things going on.   Request RF Flonase.  HM:  declines PCV, Shingles vac.  Flu shot at [facility] on [address] last wk.  Did receive call from [place] but declined to schedule until he figures out what is going on with jt.  Due for MWV  Patient Active Problem List   Diagnosis Date Noted   Rheumatoid factor positive 01/02/2023   High risk medication use 01/02/2023   Palpitations 07/23/2022   Glaucoma 01/01/2021   Elevated PSA 01/01/2021   Decreased hearing of both ears 01/01/2021   Premature atrial contractions 10/01/2020   Aortic atherosclerosis (HCC) 10/01/2020   PVC (premature ventricular contraction) 06/28/2020   Floaters in visual field, right 06/28/2020   History of COVID-19 04/13/2020   Essential hypertension 07/06/2019   Refusal of blood transfusions as patient is Jehovah's Witness  05/23/2014   BPH (benign prostatic hyperplasia) 03/15/2014   Erectile dysfunction 03/15/2014   Leukopenia 05/06/2012   Allergic rhinitis 05/06/2012   Hyperlipidemia      Current Outpatient Medications on File Prior to Visit  Medication Sig Dispense Refill   amLODipine (NORVASC) 10 MG tablet Take 1 tablet (10 mg total) by mouth daily. (Patient taking differently: Take 10 mg by mouth daily. Patient takes 1/2 pill daily) 90 tablet 1   aspirin EC 81 MG tablet Take 81 mg by mouth daily.     IBUPROFEN PO Take by mouth.     Multiple Vitamin (MULTIVITAMIN) tablet Take 1 tablet by mouth daily.     tamsulosin (FLOMAX) 0.4 MG CAPS capsule Take 0.4 mg by mouth.     Travoprost, BAK Free, (TRAVATAN) 0.004 % SOLN ophthalmic solution INSTILL 1 DROP INTO EACH EYE ONCE DAILY AT NIGHT     colchicine 0.6 MG tablet 1 tab PO daily.  Start after finishing Prednisone taper (Patient not taking: Reported on 02/02/2023) 28 tablet 0   predniSONE (STERAPRED UNI-PAK 21 TAB) 10 MG (21) TBPK tablet Take by mouth daily. Take 6 tabs by mouth daily  for 2 days, then 5 tabs for 2 days, then 4 tabs for 2 days, then 3 tabs for 2 days, 2 tabs for 2 days, then 1 tab by mouth daily for 2 days (Patient not taking: Reported on 02/02/2023) 42 tablet 0  No current facility-administered medications on file prior to visit.    Allergies  Allergen Reactions   Pseudoephedrine Other (See Comments)    Testicular pain and tenderness   Penicillins Rash    Has patient had a PCN reaction causing immediate rash, facial/tongue/throat swelling, SOB or lightheadedness with hypotension: No Has patient had a PCN reaction causing severe rash involving mucus membranes or skin necrosis: No Has patient had a PCN reaction that required hospitalization No Has patient had a PCN reaction occurring within the last 10 years: No If all of the above answers are "NO", then may proceed with Cephalosporin use.     Social History   Socioeconomic History    Marital status: Married    Spouse name: Not on file   Number of children: Not on file   Years of education: Not on file   Highest education level: 12th grade  Occupational History   Not on file  Tobacco Use   Smoking status: Former    Current packs/day: 1.50    Average packs/day: 1.5 packs/day for 5.0 years (7.5 ttl pk-yrs)    Types: Cigarettes    Passive exposure: Never   Smokeless tobacco: Never  Vaping Use   Vaping status: Never Used  Substance and Sexual Activity   Alcohol use: Yes    Alcohol/week: 7.0 standard drinks of alcohol    Types: 4 Glasses of wine, 3 Cans of beer per week   Drug use: No   Sexual activity: Not on file  Other Topics Concern   Not on file  Social History Narrative   Lives wife wife.  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    Social Determinants of Health   Financial Resource Strain: Low Risk  (09/02/2022)   Overall Financial Resource Strain (CARDIA)    Difficulty of Paying Living Expenses: Not hard at all  Food Insecurity: No Food Insecurity (09/02/2022)   Hunger Vital Sign    Worried About Running Out of Food in the Last Year: Never true    Ran Out of Food in the Last Year: Never true  Transportation Needs: No Transportation Needs (09/02/2022)   PRAPARE - Administrator, Civil Service (Medical): No    Lack of Transportation (Non-Medical): No  Physical Activity: Sufficiently Active (09/02/2022)   Exercise Vital Sign    Days of Exercise per Week: 7 days    Minutes of Exercise per Session: 30 min  Stress: No Stress Concern Present (09/02/2022)   [employer] of Occupational Health - Occupational Stress Questionnaire    Feeling of Stress : Not at all  Social Connections: Socially Integrated (09/02/2022)   Social Connection and Isolation Panel [NHANES]    Frequency of Communication with Friends and Family: More than three times a week    Frequency of Social Gatherings with Friends and Family: Not on file    Attends  Religious Services: More than 4 times per year    Active Member of [employer] or Organizations: Yes    Attends Engineer, structural: More than 4 times per year    Marital Status: Married  Catering manager Violence: Not on file    Family History  Problem Relation Age of Onset   Diabetes Mother    Heart disease Mother    Stroke Mother    Clotting disorder Sister        Daughter also with clotting disorder.   Colon cancer Neg Hx    Esophageal cancer Neg Hx  Rectal cancer Neg Hx    Stomach cancer Neg Hx     Past Surgical History:  Procedure Laterality Date   COLONOSCOPY     15 years ago   HERNIA REPAIR     Inguinal     ROS: Review of Systems Negative except as stated above  PHYSICAL EXAM: BP 136/84   Pulse 62   Wt 185 lb 6.4 oz (84.1 kg)   SpO2 100%   BMI 28.19 kg/m   Physical Exam   General appearance - alert, well appearing, and in no distress Mental status - normal mood, behavior, speech, dress, motor activity, and thought processes Chest - clear to auscultation, no wheezes, rales or rhonchi, symmetric air entry Heart - normal rate, regular rhythm, normal S1, S2, no murmurs, rubs, clicks or gallops Extremities - peripheral pulses normal, no pedal edema, no clubbing or cyanosis     Latest Ref Rng & Units 01/02/2023    8:58 AM 08/12/2022    1:43 PM 10/22/2021   11:29 AM  CMP  Glucose 65 - 99 mg/dL 657  96  87   BUN 7 - 25 mg/dL 18  11  13    Creatinine 0.70 - 1.28 mg/dL 8.46  9.62  9.52   Sodium 135 - 146 mmol/L 140  140  140   Potassium 3.5 - 5.3 mmol/L 4.5  4.7  4.5   Chloride 98 - 110 mmol/L 107  103  106   CO2 20 - 32 mmol/L 27  26  23    Calcium 8.6 - 10.3 mg/dL 84.1  32.4  40.1   Total Protein 6.1 - 8.1 g/dL 6.9   6.7   Total Bilirubin 0.2 - 1.2 mg/dL 0.4   0.6   Alkaline Phos 44 - 121 IU/L   54   AST 10 - 35 U/L 20   17   ALT 9 - 46 U/L 17   13    Lipid Panel     Component Value Date/Time   CHOL 161 10/22/2021 1129   TRIG 57 10/22/2021 1129    HDL 47 10/22/2021 1129   CHOLHDL 3.4 10/22/2021 1129   CHOLHDL 3.3 03/15/2014 1113   VLDL 15 03/15/2014 1113   LDLCALC 102 (H) 10/22/2021 1129   LDLDIRECT 106 (H) 05/05/2012 1032    CBC    Component Value Date/Time   WBC 5.5 09/10/2022 1347   WBC 6.8 06/01/2021 1234   RBC 4.48 09/10/2022 1347   RBC 4.98 06/01/2021 1234   HGB 12.3 (L) 09/10/2022 1347   HCT 37.2 (L) 09/10/2022 1347   PLT 197 09/10/2022 1347   MCV 83 09/10/2022 1347   MCH 27.5 09/10/2022 1347   MCH 28.3 06/01/2021 1234   MCHC 33.1 09/10/2022 1347   MCHC 33.1 06/01/2021 1234   RDW 13.5 09/10/2022 1347   LYMPHSABS 0.7 09/10/2022 1347   MONOABS 0.5 06/01/2021 1234   EOSABS 0.1 09/10/2022 1347   BASOSABS 0.0 09/10/2022 1347    ASSESSMENT AND PLAN:  1. Essential hypertension Repeat blood pressure today is better but not at goal.  However patient reports good blood pressure readings at home.  He will continue Norvasc.  Advised that ibuprofen can cause increase in blood pressure and recommend against long-term use.  It can also adversely affect the kidneys.  2. Polyarthritis Keep upcoming appointment with his rheumatologist tomorrow.  Question of whether he has underlying inflammatory arthritis on top of OA.  3. Screening for colon cancer Encouraged him to call and schedule  his colonoscopy when he is ready to have it done.  4. Pneumococcal vaccination declined Recommended.  Patient declined.  He also declined shingles vaccine.  5. Seasonal allergies - fluticasone (FLONASE) 50 MCG/ACT nasal spray; Place 1 spray into both nostrils daily.  Dispense: 16 g; Refill: 1    Patient was given the opportunity to ask questions.  Patient verbalized understanding of the plan and was able to repeat key elements of the plan.   This documentation was completed using Paediatric nurse.  Any transcriptional errors are unintentional.  No orders of the defined types were placed in this  encounter.    Requested Prescriptions   Signed Prescriptions Disp Refills   fluticasone (FLONASE) 50 MCG/ACT nasal spray 16 g 1    Sig: Place 1 spray into both nostrils daily.    Return in about 4 months (around 06/02/2023) for Medicare Wellness Visit in  2  wks with CMA.  Jonah Blue, MD, FACP

## 2023-02-03 ENCOUNTER — Ambulatory Visit: Payer: No Typology Code available for payment source | Admitting: Internal Medicine

## 2023-02-04 ENCOUNTER — Telehealth: Payer: Self-pay

## 2023-02-04 NOTE — Telephone Encounter (Signed)
Transition Care Management Unsuccessful Follow-up Telephone Call  Date of discharge and from where:  [facility] 9/30  Attempts:  1st  Reason for unsuccessful TCM follow-up call:  No answer/busy   Lenard For[place]  [facility], [facility] Guide, Phone: [phone] Website: [email or web address]

## 2023-02-05 ENCOUNTER — Telehealth: Payer: Self-pay

## 2023-02-05 NOTE — Telephone Encounter (Signed)
Transition Care Management Unsuccessful Follow-up Telephone Call  Date of discharge and from where:  Leon[facility] 10/6  Attempts:  2nd Attempt  Reason for unsuccessful TCM follow-up call:  No answer/busy   [facility]  [facility], [facility] Guide, Phone: [phone] Website: [email or web address]

## 2023-02-10 ENCOUNTER — Other Ambulatory Visit: Payer: Self-pay | Admitting: Internal Medicine

## 2023-02-10 DIAGNOSIS — I10 Essential (primary) hypertension: Secondary | ICD-10-CM

## 2023-02-11 DIAGNOSIS — H401122 Primary open-angle glaucoma, left eye, moderate stage: Secondary | ICD-10-CM | POA: Diagnosis not present

## 2023-02-11 DIAGNOSIS — H401111 Primary open-angle glaucoma, right eye, mild stage: Secondary | ICD-10-CM | POA: Diagnosis not present

## 2023-02-17 ENCOUNTER — Ambulatory Visit: Payer: No Typology Code available for payment source | Attending: Internal Medicine

## 2023-02-17 VITALS — Ht 68.0 in | Wt 184.0 lb

## 2023-02-17 DIAGNOSIS — Z0000 Encounter for general adult medical examination without abnormal findings: Secondary | ICD-10-CM | POA: Diagnosis not present

## 2023-02-17 NOTE — Patient Instructions (Signed)
Leonard Bryant , Thank you for taking time to come for your Medicare Wellness Visit. I appreciate your ongoing commitment to your health goals. Please review the following plan we discussed and let me know if I can assist you in the future.   Referrals/Orders/Follow-Ups/Clinician Recommendations: No; Patient advised to contact Dr. Derl Barrow office to schedule colonoscopy.  This is a list of the screening recommended for you and due dates:  Health Maintenance  Topic Date Due   Colon Cancer Screening  08/17/2022   Pneumonia Vaccine (2 of 2 - PPSV23 or PCV20) 02/02/2024*   Zoster (Shingles) Vaccine (1 of 2) 02/02/2024*   COVID-19 Vaccine (5 - 2023-24 season) 03/31/2023   Medicare Annual Wellness Visit  02/17/2024   DTaP/Tdap/Td vaccine (2 - Td or Tdap) 03/15/2024   Flu Shot  Completed   Hepatitis C Screening  Completed   HPV Vaccine  Aged Out  *Topic was postponed. The date shown is not the original due date.    Advanced directives: (Declined) Advance directive discussed with you today. Even though you declined this today, please call our office should you change your mind, and we can give you the proper paperwork for you to fill out.  Next Medicare Annual Wellness Visit scheduled for next year: Yes

## 2023-02-17 NOTE — Progress Notes (Cosign Needed Addendum)
Subjective:   Leonard Bryant is a 72 y.o. male who presents for an Initial Medicare Annual Wellness Visit.  Visit Complete: Virtual I connected with  CORIE POUR on 02/17/23 by a audio enabled telemedicine application and verified that I am speaking with the correct person using two identifiers.  Patient Location: Home  Provider Location: Office/Clinic  I discussed the limitations of evaluation and management by telemedicine. The patient expressed understanding and agreed to proceed.  Vital Signs: Because this visit was a virtual/telehealth visit, some criteria may be missing or patient reported. Any vitals not documented were not able to be obtained and vitals that have been documented are patient reported.  Cardiac Risk Factors include: advanced age (>55men, >30 women);family history of premature cardiovascular disease;hypertension;male gender     Objective:    Today's Vitals   02/17/23 0936  Weight: 184 lb (83.5 kg)  Height: 5\' 8"  (1.727 m)  PainSc: 0-No pain   Body mass index is 27.98 kg/m.     02/17/2023    9:21 AM 01/03/2023    8:19 PM  Advanced Directives  Does Patient Have a Medical Advance Directive? No No  Would patient like information on creating a medical advance directive? No - Patient declined     Current Medications (verified) Outpatient Encounter Medications as of 02/17/2023  Medication Sig   amLODipine (NORVASC) 10 MG tablet Take 1 tablet by mouth once daily   aspirin EC 81 MG tablet Take 81 mg by mouth daily.   colchicine 0.6 MG tablet 1 tab PO daily.  Start after finishing Prednisone taper (Patient not taking: Reported on 02/02/2023)   fluticasone (FLONASE) 50 MCG/ACT nasal spray Place 1 spray into both nostrils daily.   folic acid (FOLVITE) 1 MG tablet Take 1 tablet (1 mg total) by mouth daily.   IBUPROFEN PO Take by mouth.   methotrexate (RHEUMATREX) 2.5 MG tablet Take 6 tablets (15 mg total) by mouth once a week. Caution:Chemotherapy. Protect from  light.   Multiple Vitamin (MULTIVITAMIN) tablet Take 1 tablet by mouth daily.   tamsulosin (FLOMAX) 0.4 MG CAPS capsule Take 0.4 mg by mouth.   timolol (TIMOPTIC) 0.5 % ophthalmic solution INSTILL 1 DROP INTO EACH EYE IN THE MORNING AS DIRECTED   Travoprost, BAK Free, (TRAVATAN) 0.004 % SOLN ophthalmic solution INSTILL 1 DROP INTO EACH EYE ONCE DAILY AT NIGHT   No facility-administered encounter medications on file as of 02/17/2023.    Allergies (verified) Pseudoephedrine and Penicillins   History: Past Medical History:  Diagnosis Date   Arthritis    Possible arthritis in neck   Hypertension    Kidney stones    Past Surgical History:  Procedure Laterality Date   COLONOSCOPY     15 years ago   HERNIA REPAIR     Inguinal    Family History  Problem Relation Age of Onset   Diabetes Mother    Heart disease Mother    Stroke Mother    Clotting disorder Sister        Daughter also with clotting disorder.   Colon cancer Neg Hx    Esophageal cancer Neg Hx    Rectal cancer Neg Hx    Stomach cancer Neg Hx    Social History   Socioeconomic History   Marital status: Married    Spouse name: Not on file   Number of children: Not on file   Years of education: Not on file   Highest education level: 12th grade  Occupational  History   Not on file  Tobacco Use   Smoking status: Former    Current packs/day: 1.50    Average packs/day: 1.5 packs/day for 5.0 years (7.5 ttl pk-yrs)    Types: Cigarettes    Passive exposure: Never   Smokeless tobacco: Never  Vaping Use   Vaping status: Never Used  Substance and Sexual Activity   Alcohol use: Yes    Alcohol/week: 7.0 standard drinks of alcohol    Types: 4 Glasses of wine, 3 Cans of beer per week   Drug use: No   Sexual activity: Not on file  Other Topics Concern   Not on file  Social History Narrative   Lives wife wife.  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    Social Determinants of Health   Financial  Resource Strain: Low Risk  (02/17/2023)   Overall Financial Resource Strain (CARDIA)    Difficulty of Paying Living Expenses: Not hard at all  Food Insecurity: No Food Insecurity (02/17/2023)   Hunger Vital Sign    Worried About Running Out of Food in the Last Year: Never true    Ran Out of Food in the Last Year: Never true  Transportation Needs: No Transportation Needs (02/17/2023)   PRAPARE - Administrator, Civil Service (Medical): No    Lack of Transportation (Non-Medical): No  Physical Activity: Sufficiently Active (02/17/2023)   Exercise Vital Sign    Days of Exercise per Week: 7 days    Minutes of Exercise per Session: 30 min  Stress: No Stress Concern Present (02/17/2023)   [employer] of Occupational Health - Occupational Stress Questionnaire    Feeling of Stress : Not at all  Social Connections: Socially Integrated (02/17/2023)   Social Connection and Isolation Panel [NHANES]    Frequency of Communication with Friends and Family: More than three times a week    Frequency of Social Gatherings with Friends and Family: More than three times a week    Attends Religious Services: More than 4 times per year    Active Member of [employer] or Organizations: Yes    Attends Engineer, structural: More than 4 times per year    Marital Status: Married    Tobacco Counseling Counseling given: Not Answered   Clinical Intake:  Pre-visit preparation completed: Yes  Pain : No/denies pain Pain Score: 0-No pain     BMI - recorded: 27.98 Nutritional Status: BMI 25 -29 Overweight Nutritional Risks: None Diabetes: No  How often do you need to have someone help you when you read instructions, pamphlets, or other written materials from your doctor or pharmacy?: 1 - Never What is the last grade level you completed in school?: HSG  Interpreter Needed?: No  Information entered by :: Tallis Soledad N. Hymen Arnett, LPN.   Activities of Daily Living    02/17/2023    9:42  AM  In your present state of health, do you have any difficulty performing the following activities:  Hearing? 1  Comment No hearing aids.  Vision? 0  Difficulty concentrating or making decisions? 0  Walking or climbing stairs? 0  Dressing or bathing? 0  Doing errands, shopping? 0  Preparing Food and eating ? N  Using the Toilet? N  In the past six months, have you accidently leaked urine? N  Do you have problems with loss of bowel control? N  Managing your Medications? N  Managing your Finances? N  Housekeeping or managing your Housekeeping? N  Patient Care Team: Marcine Matar, MD as PCP - General (Internal Medicine) Rollene Rotunda, MD as PCP - Cardiology (Cardiology) Martin Majestic, OD. as Consulting Physician (Optometry) Dimple Casey, Jamesetta Orleans, MD as Consulting Physician (Rheumatology)  Indicate any recent Medical Services you may have received from other than [facility] providers in the past year (date may be approximate).     Assessment:   This is a routine wellness examination for Mikkel.  Hearing/Vision screen Hearing Screening - Comments:: Patient has hearing difficulty; No hearing aids. Vision Screening - Comments:: Patient does wear corrective lenses/contacts/readers.  Annual eye exam done by: Martin Majestic, OD.    Goals Addressed             This Visit's Progress    Client understands the importance of follow-up with providers by attending scheduled visits        Depression Screen    02/17/2023    9:37 AM 09/02/2022    9:21 AM 08/12/2022    1:16 PM 04/29/2022    9:17 AM 10/22/2021   10:30 AM 01/01/2021   10:44 AM 12/30/2018   11:09 AM  PHQ 2/9 Scores  PHQ - 2 Score 0 0 0 0 0 0 0  PHQ- 9 Score 0 0 0 0 0  0    Fall Risk    02/02/2023    8:38 AM 09/10/2022   10:14 AM 09/02/2022    8:41 AM 10/22/2021   10:30 AM 01/01/2021   10:44 AM  Fall Risk   Falls in the past year? 0 0 0 0 0  Number falls in past yr: 0 0 0 0 0  Injury with Fall? 0 0 0 0 0  Risk for fall  due to : No Fall Risks No Fall Risks No Fall Risks No Fall Risks No Fall Risks  Follow up    Falls evaluation completed     MEDICARE RISK AT HOME: Medicare Risk at Home Any stairs in or around the home?: Yes If so, are there any without handrails?: No Home free of loose throw rugs in walkways, pet beds, electrical cords, etc?: Yes Adequate lighting in your home to reduce risk of falls?: Yes Life alert?: No Use of a cane, walker or w/c?: No Grab bars in the bathroom?: Yes Shower chair or bench in shower?: No Elevated toilet seat or a handicapped toilet?: No  TIMED UP AND GO:  Was the test performed? No    Cognitive Function:    02/17/2023    9:43 AM  MMSE - Mini Mental State Exam  Not completed: Unable to complete        02/17/2023    9:43 AM  6CIT Screen  What Year? 0 points  What month? 0 points  What time? 0 points  Count back from 20 0 points  Months in reverse 0 points  Repeat phrase 0 points  Total Score 0 points    Immunizations Immunization History  Administered Date(s) Administered   Influenza-Unspecified 01/28/2023   Moderna Covid-19 Vaccine Bivalent Booster 41yrs & up 02/03/2023   Moderna SARS-COV2 Booster Vaccination 05/03/2020   Moderna Sars-Covid-2 Vaccination 08/29/2019, 09/26/2019   Pfizer Covid-19 Vaccine Bivalent Booster 70yrs & up 12/05/2020   Pneumococcal Conjugate-13 12/30/2018   Tdap 03/15/2014    TDAP status: Up to date  Flu Vaccine status: Up to date  Pneumococcal vaccine status: Due, Education has been provided regarding the importance of this vaccine. Advised may receive this vaccine at local pharmacy or Health Dept. Aware to  provide a copy of the vaccination record if obtained from local pharmacy or Health Dept. Verbalized acceptance and understanding.  Covid-19 vaccine status: Completed vaccines  Qualifies for Shingles Vaccine? Yes   Zostavax completed No   Shingrix Completed?: No.    Education has been provided regarding the  importance of this vaccine. Patient has been advised to call insurance company to determine out of pocket expense if they have not yet received this vaccine. Advised may also receive vaccine at local pharmacy or Health Dept. Verbalized acceptance and understanding.  Screening Tests Health Maintenance  Topic Date Due   Colonoscopy  08/17/2022   Pneumonia Vaccine 52+ Years old (2 of 2 - PPSV23 or PCV20) 02/02/2024 (Originally 12/30/2019)   Zoster Vaccines- Shingrix (1 of 2) 02/02/2024 (Originally 03/02/1970)   COVID-19 Vaccine (5 - 2023-24 season) 03/31/2023   Medicare Annual Wellness (AWV)  02/17/2024   DTaP/Tdap/Td (2 - Td or Tdap) 03/15/2024   INFLUENZA VACCINE  Completed   Hepatitis C Screening  Completed   HPV VACCINES  Aged Out    Health Maintenance  Health Maintenance Due  Topic Date Due   Colonoscopy  08/17/2022    Colorectal cancer screening: Type of screening: Colonoscopy. Completed 08/17/2019. Repeat every 3 years-Patient will call to schedule for 2025.  Lung Cancer Screening: (Low Dose CT Chest recommended if Age 24-80 years, 20 pack-year currently smoking OR have quit w/in 15years.) does not qualify.   Lung Cancer Screening Referral: no  Additional Screening:  Hepatitis C Screening: does qualify; Completed 223/2016  Vision Screening: Recommended annual ophthalmology exams for early detection of glaucoma and other disorders of the eye. Is the patient up to date with their annual eye exam?  Yes  Who is the provider or what is the name of the office in which the patient attends annual eye exams? Martin Majestic, OD. If pt is not established with a provider, would they like to be referred to a provider to establish care? No .   Dental Screening: Recommended annual dental exams for proper oral hygiene  Diabetic Foot Exam: N/A  Community Resource Referral / Chronic Care Management: CRR required this visit?  No   CCM required this visit?  No    Plan:     I have  personally reviewed and noted the following in the patient's chart:   Medical and social history Use of alcohol, tobacco or illicit drugs  Current medications and supplements including opioid prescriptions. Patient is not currently taking opioid prescriptions. Functional ability and status Nutritional status Physical activity Advanced directives List of other physicians Hospitalizations, surgeries, and ER visits in previous 12 months Vitals Screenings to include cognitive, depression, and falls Referrals and appointments  In addition, I have reviewed and discussed with patient certain preventive protocols, quality metrics, and best practice recommendations. A written personalized care plan for preventive services as well as general preventive health recommendations were provided to patient.     Mickeal Needy, LPN   [id number]   After Visit Summary: (MyChart) Due to this being a telephonic visit, the after visit summary with patients personalized plan was offered to patient via MyChart   Nurse Notes: None

## 2023-03-02 DIAGNOSIS — R972 Elevated prostate specific antigen [PSA]: Secondary | ICD-10-CM | POA: Diagnosis not present

## 2023-03-04 ENCOUNTER — Telehealth: Payer: Self-pay | Admitting: Internal Medicine

## 2023-03-04 NOTE — Telephone Encounter (Signed)
Patient states he has decided against taking the Methotrexate medication.  Patient states he will discuss other options at his appointment on 03/17/23.

## 2023-03-05 NOTE — Progress Notes (Signed)
Office Visit Note  Patient: Leonard Bryant             Date of Birth: 04-16-1950           MRN: [id number]             PCP: Marcine Matar, MD Referring: Marcine Matar, MD Visit Date: 03/17/2023   Subjective:  Follow-up   Discussed the use of AI scribe software for clinical note transcription with the patient, who gave verbal consent to proceed.  History of Present Illness   Leonard Bryant is a 72 y.o. male here for follow up for seropositive rheumatoid arthritis.  He presents with complaints of left shoulder and wrist discomfort. The discomfort in the shoulder joint is described as minor and is exacerbated by overhead activities such as hanging pictures and installing exit lights. The patient also reports a sensation in the wrist area, which is managed by avoiding overuse. The patient has been self-managing these symptoms with occasional use of ibuprofen, with only two tablets taken since the last visit due to back discomfort.  The patient also reports a previous episode of severe wrist pain that was evaluated in the emergency department. This episode was associated with shoulder discomfort and radiating pain, which was so severe that it prevented the patient from lying down or sleeping. However, no significant swelling was noted during these episodes.  The patient was previously recommended to start methotrexate for rheumatoid arthritis but decided against it due to concerns about potential immune system suppression. The patient has chosen to monitor the condition and manage symptoms as they arise.  The patient also reports some difficulty and discomfort when washing their back due to reduced flexibility in the left hand compared to the right. This discomfort is localized to the wrist area.  The patient has not had any major injuries to the shoulder such as fractures, dislocations, or surgeries. The patient also reports a history of back pain, which was managed with exercises  recommended by a doctor. The patient has been applying castor oil to painful areas as a form of unconventional medicine, which provides some relief.  The patient's symptoms are primarily provoked by repetitive activities and overuse, and they have been taking steps to protect and avoid overworking the affected joints.    Previous HPI 02/02/2023 Leonard Bryant is a 72 y.o. male here for follow up for seropositive rheumatoid arthritis.  Workup at our initial visit was somewhat nonspecific with normal serum inflammatory markers and x-rays showing degenerative arthritis changes but highly positive CCP antibody.  About 2 days after that visit he had to go to the emergency department due to severe left wrist pain and swelling.  This was treated with a 21 tablet prednisone taper pack and symptoms improved.  Currently he is off any medication and symptoms are doing very well today   Previous HPI 01/02/23 Leonard Bryant is a 72 y.o. male here for evaluation of joint pain and swelling affecting multiple areas with positive rheumatoid factor.  Developed pain and swelling at his left wrist and fingers of left hand starting about 6 months ago.  Before this he did have a motorcycle ride to [place] and back but did not recall any out of the usual events or stress during that.  He was evaluated and took a 6-day prednisone taper that resolved his symptoms.  About 2 to 3 weeks later developed another episode of pain and swelling at the left hand and wrist  which again resolved after a prednisone taper.  He had a third episode with this in May went to the urgent care for evaluation had some labs drawn that showed a positive rheumatoid factor of 53.6 subsequently went back to his primary office and was prescribed another course of oral prednisone that resolved symptoms.  Additional labs checked in June with a negative ANA and normal uric acid level also findings consistent for iron deficiency anemia.  He has found ibuprofen very  effective for symptoms if taken preventatively or at the very outset of inflammation but once symptoms are worsening for more than a day or 2 this is ineffective and needed the steroid treatments.  Since then he is taking ibuprofen 800 mg about 3 times daily for almost 5 months now.  With this his symptoms have been pretty well-controlled mostly just getting aches and pains with use including muscle pain about 2 days after physical activities.  Currently has some aches with his shoulders and in the right elbow. Does not have any known family history of inflammatory or autoimmune disease.  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  He also questions whether he had some rotator cuff problems sometimes getting pain with lifting activities but does not have a lot of baseline pain nighttime pain or decreased mobility.   Labs reviewed 08/2022 ANA neg Uric acid 4.1 Iron 23   07/2022 RF 53.6 ESR 11   05/2014 HCV neg   Review of Systems  Constitutional:  Negative for fatigue.  HENT:  Negative for mouth sores and mouth dryness.   Eyes:  Positive for dryness.  Respiratory:  Negative for shortness of breath.   Cardiovascular:  Negative for chest pain and palpitations.  Gastrointestinal:  Negative for blood in stool, constipation and diarrhea.  Endocrine: Positive for increased urination.  Genitourinary:  Negative for involuntary urination.  Musculoskeletal:  Positive for joint pain and joint pain. Negative for gait problem, joint swelling, myalgias, muscle weakness, morning stiffness, muscle tenderness and myalgias.  Skin:  Positive for color change. Negative for rash, hair loss and sensitivity to sunlight.  Allergic/Immunologic: Negative for susceptible to infections.  Neurological:  Negative for dizziness and headaches.  Hematological:  Negative for swollen glands.  Psychiatric/Behavioral:   Negative for depressed mood and sleep disturbance. The patient is not nervous/anxious.     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   Premature atrial contractions 10/01/2020   Aortic atherosclerosis (HCC) 10/01/2020   PVC (premature ventricular contraction) 06/28/2020   Floaters in visual field, right 06/28/2020   History of COVID-19 04/13/2020   Essential hypertension 07/06/2019   Refusal of blood transfusions as patient is Jehovah's Witness 05/23/2014   BPH (benign prostatic hyperplasia) 03/15/2014   Erectile dysfunction 03/15/2014   Leukopenia 05/06/2012   Allergic rhinitis 05/06/2012   Hyperlipidemia     Past Medical History:  Diagnosis Date   Arthritis    Possible arthritis in neck   Hypertension    Kidney stones     Family History  Problem Relation Age of Onset   Diabetes Mother    Heart disease Mother    Stroke Mother  Clotting disorder Sister        Daughter also with clotting disorder.   Colon cancer Neg Hx    Esophageal cancer Neg Hx    Rectal cancer Neg Hx    Stomach cancer Neg Hx    Past Surgical History:  Procedure Laterality Date   COLONOSCOPY     15 years ago   HERNIA REPAIR     Inguinal    Social History   Social History Narrative   Lives wife wife.  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    Immunization History  Administered Date(s) Administered   Influenza-Unspecified 01/28/2023   Moderna Covid-19 Vaccine Bivalent Booster 53yrs & up 02/03/2023   Moderna SARS-COV2 Booster Vaccination 05/03/2020   Moderna Sars-Covid-2 Vaccination 08/29/2019, 09/26/2019   Pfizer Covid-19 Vaccine Bivalent Booster 42yrs & up 12/05/2020   Pneumococcal Conjugate-13 12/30/2018   Tdap 03/15/2014     Objective: Vital  Signs: BP 139/80 (BP Location: Left Arm, Patient Position: Sitting, Cuff Size: Normal)   Pulse 60   Resp 14   Ht 5\' 8"  (1.727 m)   Wt 183 lb (83 kg)   BMI 27.83 kg/m    Physical Exam Cardiovascular:     Rate and Rhythm: Normal rate and regular rhythm.  Pulmonary:     Effort: Pulmonary effort is normal.     Breath sounds: Normal breath sounds.  Skin:    General: Skin is warm and dry.  Neurological:     Mental Status: He is alert.  Psychiatric:        Mood and Affect: Mood normal.      Musculoskeletal Exam:  Left shoulder tenderness along anterior lateral edge, slightly limited with reaching behind low back, no palpable swelling no radiation Elbows decreased extension ROM b/l without pain or swelling No left wrist tenderness or swelling Bony finger joint changes no tenderness or swelling   Investigation: No additional findings.  Imaging: No results found.  Recent Labs: Lab Results  Component Value Date   WBC 5.5 09/10/2022   HGB 12.3 (L) 09/10/2022   PLT 197 09/10/2022   NA 140 01/02/2023   K 4.5 01/02/2023   CL 107 01/02/2023   CO2 27 01/02/2023   GLUCOSE 103 (H) 01/02/2023   BUN 18 01/02/2023   CREATININE 1.05 01/02/2023   BILITOT 0.4 01/02/2023   ALKPHOS 54 10/22/2021   AST 20 01/02/2023   ALT 17 01/02/2023   PROT 6.9 01/02/2023   ALBUMIN 4.4 10/22/2021   CALCIUM 10.4 (H) 01/02/2023   GFRAA 86 12/30/2018    Speciality Comments: No specialty comments available.  Procedures:  No procedures performed Allergies: Pseudoephedrine and Penicillins   Assessment / Plan:     Visit Diagnoses: Rheumatoid factor positive No active synovitis on exam today. No erosions on prior imaging. Okay to monitor, symptomatic management NSAIDs and f/u PRN for flare.  Left Shoulder Pain Likely supraspinatus tendonitis due to overuse. No history of major injury. Pain exacerbated by overhead movements and reaching behind. No evidence of erosive joint disease on previous  imaging. -Provided physical therapy exercises for patient to perform at home. -Advise patient to continue taking ibuprofen as needed for pain, and to avoid movements that cause sharp pain. -Consider imaging or referral to physical therapy if symptoms worsen or do not improve with home exercises.  Wrist Pain No current inflammation or swelling. No evidence of erosive joint disease on previous imaging. Pain is managed symptomatically. -Consider rechecking inflammation markers or performing ultrasound imaging if  another severe flare occurs.   Orders: No orders of the defined types were placed in this encounter.  No orders of the defined types were placed in this encounter.    Follow-Up Instructions: No follow-ups on file.   Fuller Plan, MD  Note - This record has been created using [employer].  Chart creation errors have been sought, but may not always  have been located. Such creation errors do not reflect on  the standard of medical care.

## 2023-03-09 DIAGNOSIS — R3915 Urgency of urination: Secondary | ICD-10-CM | POA: Diagnosis not present

## 2023-03-09 DIAGNOSIS — R972 Elevated prostate specific antigen [PSA]: Secondary | ICD-10-CM | POA: Diagnosis not present

## 2023-03-12 ENCOUNTER — Other Ambulatory Visit: Payer: Self-pay | Admitting: Urology

## 2023-03-12 DIAGNOSIS — R972 Elevated prostate specific antigen [PSA]: Secondary | ICD-10-CM

## 2023-03-17 ENCOUNTER — Ambulatory Visit: Payer: No Typology Code available for payment source | Attending: Internal Medicine | Admitting: Internal Medicine

## 2023-03-17 ENCOUNTER — Encounter: Payer: Self-pay | Admitting: Internal Medicine

## 2023-03-17 VITALS — BP 139/80 | HR 60 | Resp 14 | Ht 68.0 in | Wt 183.0 lb

## 2023-03-17 DIAGNOSIS — R768 Other specified abnormal immunological findings in serum: Secondary | ICD-10-CM

## 2023-03-17 DIAGNOSIS — Z79899 Other long term (current) drug therapy: Secondary | ICD-10-CM | POA: Diagnosis not present

## 2023-03-17 DIAGNOSIS — M25512 Pain in left shoulder: Secondary | ICD-10-CM

## 2023-04-27 ENCOUNTER — Other Ambulatory Visit: Payer: No Typology Code available for payment source

## 2023-06-02 ENCOUNTER — Encounter: Payer: Self-pay | Admitting: Internal Medicine

## 2023-06-02 ENCOUNTER — Ambulatory Visit: Payer: Self-pay | Attending: Internal Medicine | Admitting: Internal Medicine

## 2023-06-02 VITALS — BP 136/78 | HR 77 | Temp 97.7°F | Ht 68.0 in | Wt 188.0 lb

## 2023-06-02 DIAGNOSIS — R768 Other specified abnormal immunological findings in serum: Secondary | ICD-10-CM | POA: Diagnosis not present

## 2023-06-02 DIAGNOSIS — R0982 Postnasal drip: Secondary | ICD-10-CM

## 2023-06-02 DIAGNOSIS — I10 Essential (primary) hypertension: Secondary | ICD-10-CM | POA: Diagnosis not present

## 2023-06-02 DIAGNOSIS — Z131 Encounter for screening for diabetes mellitus: Secondary | ICD-10-CM

## 2023-06-02 DIAGNOSIS — Z1211 Encounter for screening for malignant neoplasm of colon: Secondary | ICD-10-CM

## 2023-06-02 DIAGNOSIS — D649 Anemia, unspecified: Secondary | ICD-10-CM

## 2023-06-02 DIAGNOSIS — R972 Elevated prostate specific antigen [PSA]: Secondary | ICD-10-CM

## 2023-06-02 MED ORDER — FLUTICASONE PROPIONATE 50 MCG/ACT NA SUSP
1.0000 | Freq: Every day | NASAL | 1 refills | Status: DC
Start: 2023-06-02 — End: 2023-12-03

## 2023-06-02 NOTE — Patient Instructions (Signed)
 Purchase and use Claritin daily for the next 2 weeks with the Flonase nasal spray then after that use as needed.

## 2023-06-02 NOTE — Progress Notes (Signed)
 Patient ID: Leonard Bryant, male    DOB: 1950/09/20  MRN: [id number]  CC: Hypertension (HTN f/u. Med refill. /Requesting blood work to check iron levels, PSA & A1C/Discuss amlodipine dosage /Already received flu vax)   Subjective: Leonard Bryant is a 73 y.o. male who presents for chronic ds management. His concerns today include:  Pt with hx of HTN, vitiligo, glaucoma, BPH, mild aortic atherosclerosis declines statin, dec hearing, polyarthritis   Discussed the use of AI scribe software for clinical note transcription with the patient, who gave verbal consent to proceed.  History of Present Illness   The patient, with a history of hypertension, presents for a follow-up visit. He reports taking amlodipe 10 mg 1/2 tab daily.  Checks blood pressure intermittently, usually in the weeks leading up to a doctor's visit but has not done so as consistently this time. At home, his blood pressure readings are usually less than 130/80, but occasionally the systolic pressure reaches 137. He reports limiting his salt intake, but does use a low-sodium spice in his cooking. He denies any chest pain or shortness of breath.  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 He reports joint and wrist pain, which Dr. Dimple Casey attributed to an autoimmune response by his white blood cells. Methotrexate was prescribed, but the patient chose not to take it due to concerns about suppressing his immune system.  For the past 2 months, he has been waking up in the mornings with "cold and congestion" at the back of the mouth/throat.  He endorses drainage at the back of the throat.  He has been gargling with peroxide which helps and uses a Nettie pot.  He requests refill on Flonase which he does not use every day.  He also noticed twice over the past 4 months that he woke up with the tongue very dry.  He was noted  to have a mild anemia in June of last year.  Iron studies suggest anemia of chronic disease.  He would like to have iron levels rechecked.  He is also requesting PSA for prostate cancer screening.  Last PSA done in 2023 was mildly elevated at 4.4.  He also requests an A1c to screen for diabetes.   HM: He was referred for colonoscopy last year but has put off following up with scheduling.  He would like for me to resubmit the referral at this time.       Patient Active Problem List   Diagnosis Date Noted   Pain in left shoulder 03/17/2023   Rheumatoid factor positive 01/02/2023   High risk medication use 01/02/2023   Palpitations 07/23/2022   Glaucoma 01/01/2021   Elevated PSA 01/01/2021   Decreased hearing of both ears 01/01/2021   Premature atrial contractions 10/01/2020   Aortic atherosclerosis (HCC) 10/01/2020   PVC (premature ventricular contraction) 06/28/2020   Floaters in visual field, right 06/28/2020   History of COVID-19 04/13/2020   Essential hypertension 07/06/2019   Refusal of blood transfusions as patient is Jehovah's Witness 05/23/2014   BPH (benign prostatic hyperplasia) 03/15/2014   Erectile dysfunction 03/15/2014   Leukopenia 05/06/2012   Allergic rhinitis 05/06/2012   Hyperlipidemia      Current Outpatient Medications on File Prior to Visit  Medication Sig Dispense Refill   amLODipine (NORVASC) 10 MG tablet Take 1 tablet by mouth once daily 90 tablet 1   aspirin EC 81 MG tablet  Take 81 mg by mouth daily.     IBUPROFEN PO Take by mouth.     Multiple Vitamin (MULTIVITAMIN) tablet Take 1 tablet by mouth daily.     timolol (TIMOPTIC) 0.5 % ophthalmic solution INSTILL 1 DROP INTO EACH EYE IN THE MORNING AS DIRECTED     Travoprost, BAK Free, (TRAVATAN) 0.004 % SOLN ophthalmic solution INSTILL 1 DROP INTO EACH EYE ONCE DAILY AT NIGHT     tamsulosin (FLOMAX) 0.4 MG CAPS capsule Take 0.4 mg by mouth. (Patient not taking: Reported on 06/02/2023)     No current  facility-administered medications on file prior to visit.    Allergies  Allergen Reactions   Pseudoephedrine Other (See Comments)    Testicular pain and tenderness   Penicillins Rash    Has patient had a PCN reaction causing immediate rash, facial/tongue/throat swelling, SOB or lightheadedness with hypotension: No Has patient had a PCN reaction causing severe rash involving mucus membranes or skin necrosis: No Has patient had a PCN reaction that required hospitalization No Has patient had a PCN reaction occurring within the last 10 years: No If all of the above answers are "NO", then may proceed with Cephalosporin use.     Social History   Socioeconomic History   Marital status: Married    Spouse name: Not on file   Number of children: Not on file   Years of education: Not on file   Highest education level: 12th grade  Occupational History   Not on file  Tobacco Use   Smoking status: Former    Current packs/day: 1.50    Average packs/day: 1.5 packs/day for 5.0 years (7.5 ttl pk-yrs)    Types: Cigarettes    Passive exposure: Never   Smokeless tobacco: Never  Vaping Use   Vaping status: Never Used  Substance and Sexual Activity   Alcohol use: Yes    Alcohol/week: 7.0 standard drinks of alcohol    Types: 4 Glasses of wine, 3 Cans of beer per week   Drug use: No   Sexual activity: Not on file  Other Topics Concern   Not on file  Social History Narrative   Lives wife wife.  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    Social Drivers of Corporate investment banker Strain: Low Risk  (02/17/2023)   Overall Financial Resource Strain (CARDIA)    Difficulty of Paying Living Expenses: Not hard at all  Food Insecurity: No Food Insecurity (02/17/2023)   Hunger Vital Sign    Worried About Running Out of Food in the Last Year: Never true    Ran Out of Food in the Last Year: Never true  Transportation Needs: No Transportation Needs (02/17/2023)   PRAPARE -  Administrator, Civil Service (Medical): No    Lack of Transportation (Non-Medical): No  Physical Activity: Sufficiently Active (02/17/2023)   Exercise Vital Sign    Days of Exercise per Week: 7 days    Minutes of Exercise per Session: 30 min  Stress: No Stress Concern Present (02/17/2023)   [employer] of Occupational Health - Occupational Stress Questionnaire    Feeling of Stress : Not at all  Social Connections: Socially Integrated (02/17/2023)   Social Connection and Isolation Panel [NHANES]    Frequency of Communication with Friends and Family: More than three times a week    Frequency of Social Gatherings with Friends and Family: More than three times a week    Attends Religious Services: More  than 4 times per year    Active Member of Clubs or Organizations: Yes    Attends Banker Meetings: More than 4 times per year    Marital Status: Married  Catering manager Violence: Not At Risk (02/17/2023)   Humiliation, Afraid, Rape, and Kick questionnaire    Fear of Current or Ex-Partner: No    Emotionally Abused: No    Physically Abused: No    Sexually Abused: No    Family History  Problem Relation Age of Onset   Diabetes Mother    Heart disease Mother    Stroke Mother    Clotting disorder Sister        Daughter also with clotting disorder.   Colon cancer Neg Hx    Esophageal cancer Neg Hx    Rectal cancer Neg Hx    Stomach cancer Neg Hx     Past Surgical History:  Procedure Laterality Date   COLONOSCOPY     15 years ago   HERNIA REPAIR     Inguinal     ROS: Review of Systems Negative except as stated above  PHYSICAL EXAM: BP 136/78   Pulse 77   Temp 97.7 F (36.5 C) (Oral)   Ht 5\' 8"  (1.727 m)   Wt 188 lb (85.3 kg)   SpO2 98%   BMI 28.59 kg/m   Physical Exam   General appearance - alert, well appearing, older African-American male and in no distress Mental status - normal mood, behavior, speech, dress, motor activity, and  thought processes Nose - mild enlargement of nasal turbinates Mouth - mucous membranes moist, pharynx normal without lesions Chest - clear to auscultation, no wheezes, rales or rhonchi, symmetric air entry Heart - normal rate, regular rhythm, normal S1, S2, no murmurs, rubs, clicks or gallops     Latest Ref Rng & Units 01/02/2023    8:58 AM 08/12/2022    1:43 PM 10/22/2021   11:29 AM  CMP  Glucose 65 - 99 mg/dL 161  96  87   BUN 7 - 25 mg/dL 18  11  13    Creatinine 0.70 - 1.28 mg/dL 0.96  0.45  4.09   Sodium 135 - 146 mmol/L 140  140  140   Potassium 3.5 - 5.3 mmol/L 4.5  4.7  4.5   Chloride 98 - 110 mmol/L 107  103  106   CO2 20 - 32 mmol/L 27  26  23    Calcium 8.6 - 10.3 mg/dL 81.1  91.4  78.2   Total Protein 6.1 - 8.1 g/dL 6.9   6.7   Total Bilirubin 0.2 - 1.2 mg/dL 0.4   0.6   Alkaline Phos 44 - 121 IU/L   54   AST 10 - 35 U/L 20   17   ALT 9 - 46 U/L 17   13    Lipid Panel     Component Value Date/Time   CHOL 161 10/22/2021 1129   TRIG 57 10/22/2021 1129   HDL 47 10/22/2021 1129   CHOLHDL 3.4 10/22/2021 1129   CHOLHDL 3.3 03/15/2014 1113   VLDL 15 03/15/2014 1113   LDLCALC 102 (H) 10/22/2021 1129   LDLDIRECT 106 (H) 05/05/2012 1032    CBC    Component Value Date/Time   WBC 5.5 09/10/2022 1347   WBC 6.8 06/01/2021 1234   RBC 4.48 09/10/2022 1347   RBC 4.98 06/01/2021 1234   HGB 12.3 (L) 09/10/2022 1347   HCT 37.2 (L) 09/10/2022 1347  PLT 197 09/10/2022 1347   MCV 83 09/10/2022 1347   MCH 27.5 09/10/2022 1347   MCH 28.3 06/01/2021 1234   MCHC 33.1 09/10/2022 1347   MCHC 33.1 06/01/2021 1234   RDW 13.5 09/10/2022 1347   LYMPHSABS 0.7 09/10/2022 1347   MONOABS 0.5 06/01/2021 1234   EOSABS 0.1 09/10/2022 1347   BASOSABS 0.0 09/10/2022 1347    ASSESSMENT AND PLAN: 1. Essential hypertension (Primary) Repeat blood pressure better but not at goal.  He will continue amlodipine 5 mg daily and continue to monitor blood pressure.  2. Rheumatoid factor  positive Possible RA.  Being followed by rheumatology.  3. Elevated PSA Pt with mild elevation in 2023.  Advise could see mild elev in increase age - PSA  4. Normocytic anemia Asymptomatic.  Will recheck CBC and iron studies - CBC - Iron, TIBC and Ferritin Panel  5. Post-nasal drip Recommend that he purchase Claritin over-the-counter and take 1 daily for the next 2 weeks.  He should take at bedtime and use the Flonase at bedtime as well for the next 2 weeks.  After that he can use both as needed.  In regards to the dry mouth that he experienced on 2 occasions, I told him that if he is a mouth breather at night, this caused drying of the oral mucosa/tongue. - fluticasone (FLONASE) 50 MCG/ACT nasal spray; Place 1 spray into both nostrils daily.  Dispense: 16 g; Refill: 1  6. Screening for colon cancer - Ambulatory referral to Gastroenterology  7. Diabetes mellitus screening - Hemoglobin A1c    Patient was given the opportunity to ask questions.  Patient verbalized understanding of the plan and was able to repeat key elements of the plan.   This documentation was completed using Paediatric nurse.  Any transcriptional errors are unintentional.  Orders Placed This Encounter  Procedures   CBC   Iron, TIBC and Ferritin Panel   Hemoglobin A1c   PSA   Ambulatory referral to Gastroenterology     Requested Prescriptions   Signed Prescriptions Disp Refills   fluticasone (FLONASE) 50 MCG/ACT nasal spray 16 g 1    Sig: Place 1 spray into both nostrils daily.    Return in about 6 months (around 12/03/2023).  Jonah Blue, MD, FACP

## 2023-06-03 ENCOUNTER — Encounter: Payer: Self-pay | Admitting: Internal Medicine

## 2023-06-03 LAB — IRON,TIBC AND FERRITIN PANEL
Ferritin: 149 ng/mL (ref 30–400)
Iron Saturation: 21 % (ref 15–55)
Iron: 62 ug/dL (ref 38–169)
Total Iron Binding Capacity: 297 ug/dL (ref 250–450)
UIBC: 235 ug/dL (ref 111–343)

## 2023-06-03 LAB — CBC
Hematocrit: 43.2 % (ref 37.5–51.0)
Hemoglobin: 14.1 g/dL (ref 13.0–17.7)
MCH: 27.6 pg (ref 26.6–33.0)
MCHC: 32.6 g/dL (ref 31.5–35.7)
MCV: 85 fL (ref 79–97)
Platelets: 217 10*3/uL (ref 150–450)
RBC: 5.11 x10E6/uL (ref 4.14–5.80)
RDW: 13.7 % (ref 11.6–15.4)
WBC: 3.7 10*3/uL (ref 3.4–10.8)

## 2023-06-03 LAB — PSA: Prostate Specific Ag, Serum: 5.2 ng/mL — ABNORMAL HIGH (ref 0.0–4.0)

## 2023-06-03 LAB — HEMOGLOBIN A1C
Est. average glucose Bld gHb Est-mCnc: 108 mg/dL
Hgb A1c MFr Bld: 5.4 % (ref 4.8–5.6)

## 2023-08-03 ENCOUNTER — Ambulatory Visit: Attending: Internal Medicine | Admitting: Internal Medicine

## 2023-08-13 ENCOUNTER — Other Ambulatory Visit: Payer: Self-pay | Admitting: Internal Medicine

## 2023-08-13 DIAGNOSIS — I10 Essential (primary) hypertension: Secondary | ICD-10-CM

## 2023-12-02 ENCOUNTER — Telehealth: Payer: Self-pay | Admitting: Internal Medicine

## 2023-12-02 NOTE — Telephone Encounter (Addendum)
 Voicemail left by volunteer to confirm patient's appointment for 12/03/2023

## 2023-12-03 ENCOUNTER — Encounter: Payer: Self-pay | Admitting: Internal Medicine

## 2023-12-03 ENCOUNTER — Ambulatory Visit: Attending: Internal Medicine | Admitting: Internal Medicine

## 2023-12-03 VITALS — BP 157/83 | HR 53 | Temp 98.0°F | Ht 68.0 in | Wt 174.0 lb

## 2023-12-03 DIAGNOSIS — R0982 Postnasal drip: Secondary | ICD-10-CM | POA: Diagnosis not present

## 2023-12-03 DIAGNOSIS — I10 Essential (primary) hypertension: Secondary | ICD-10-CM | POA: Diagnosis not present

## 2023-12-03 DIAGNOSIS — R972 Elevated prostate specific antigen [PSA]: Secondary | ICD-10-CM

## 2023-12-03 DIAGNOSIS — Z23 Encounter for immunization: Secondary | ICD-10-CM

## 2023-12-03 DIAGNOSIS — Z1211 Encounter for screening for malignant neoplasm of colon: Secondary | ICD-10-CM

## 2023-12-03 DIAGNOSIS — R634 Abnormal weight loss: Secondary | ICD-10-CM | POA: Diagnosis not present

## 2023-12-03 MED ORDER — FLUTICASONE PROPIONATE 50 MCG/ACT NA SUSP
1.0000 | Freq: Every day | NASAL | 1 refills | Status: DC
Start: 2023-12-03 — End: 2024-01-21

## 2023-12-03 MED ORDER — AMLODIPINE BESYLATE 10 MG PO TABS: 10.0000 mg | ORAL_TABLET | Freq: Every day | ORAL | 1 refills | Status: AC

## 2023-12-03 NOTE — Patient Instructions (Signed)
 VISIT SUMMARY:  Today, we discussed your blood pressure management, elevated PSA levels, recent weight loss, and general health maintenance. We reviewed your current medications and made some adjustments to better control your blood pressure. We also talked about the importance of following up with your urologist and scheduling a colon cancer screening.  YOUR PLAN:  -ESSENTIAL HYPERTENSION: Hypertension means high blood pressure. Your blood pressure readings at home and in the office indicate that it is not well controlled. We have increased your amlodipine  dose to 10 mg daily and advised you to check your blood pressure at least twice a week. Your goal is to keep your blood pressure at or below 130/80 mmHg. Please also be mindful of your salt intake.  -ELEVATED PROSTATE SPECIFIC ANTIGEN (PSA): Elevated PSA levels can be a sign of prostate issues. Your PSA levels have been increasing, and it is important to follow up with your urologist to decide on further evaluation, such as an MRI. This will help in understanding the cause of the elevated PSA.  -ABNORMAL WEIGHT LOSS: Unintentional weight loss can be a sign of an underlying health issue. You have lost 14 pounds since March without trying. We will order some baseline blood tests, including a blood count and thyroid  function tests, to rule out any potential causes.  It is important that you get up-to-date with your age-appropriate cancer screenings.  Please call [facility] back to schedule your colonoscopy and get in with your urologist for follow-up with the elevated PSA level.   -GENERAL HEALTH MAINTENANCE: You will receive a flu shot today. It is also important to schedule your colon cancer screening as previously recommended.  INSTRUCTIONS:  Please follow up with your urologist regarding your elevated PSA levels. Schedule your colon cancer screening as soon as possible. Check your blood pressure at least twice a week and keep a log  of your readings. We will contact you with the results of your blood tests.

## 2023-12-03 NOTE — Progress Notes (Signed)
 Patient ID: Leonard Bryant, male    DOB: 11-Feb-1951  MRN: [id number]  CC: Hypertension (HTN f/u. Med refill./No questions / concerns/No to flu vax.)   Subjective: Leonard Bryant is a 73 y.o. male who presents for chronic ds management. His concerns today include:  Pt with hx of HTN, vitiligo, glaucoma, BPH, mild aortic atherosclerosis declines statin, dec hearing, polyarthritis   Discussed the use of AI scribe software for clinical note transcription with the patient, who gave verbal consent to proceed.  History of Present Illness Leonard Bryant is a 73 year old male with hypertension who presents for follow-up regarding blood pressure management.  He has been taking amlodipine  5 mg daily for hypertension, although he was previously prescribed 10 mg. He checks his blood pressure infrequently, with the last check about a month ago due to a headache. At that time, his readings were around 137/76, with one instance of 141 systolic. He attributes the headache to congestion and notes that he had a salty meal the night before this visit.  He has a history of elevated PSA levels. He last saw his urologist in December, who recommended an MRI of the prostate, which he declined. His PSA was rechecked in March and found to be more elevated than before at 5.2.  He was advised to follow-up with his urologist but has not done so as yet.   He is noted to have loss approximately 14 pounds since March, with his current weight at 174 pounds. He attributes this to possibly eating smaller meals and increased activity.  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  No hot flashes or palpitations, but he sweats easily when working.  He describes his diet as starting with a morning smoothie and sometimes not eating until later in the day around 3 p.m  HM: He has not yet scheduled a colon cancer screening despite previous referrals.  Agrees to receiving flu shot today    Patient Active Problem List   Diagnosis Date Noted   Pain in left shoulder 03/17/2023   Rheumatoid factor positive 01/02/2023   High risk medication use 01/02/2023   Palpitations 07/23/2022   Glaucoma 01/01/2021   Elevated PSA 01/01/2021   Decreased hearing of both ears 01/01/2021   Premature atrial contractions 10/01/2020   Aortic atherosclerosis (HCC) 10/01/2020   PVC (premature ventricular contraction) 06/28/2020   Floaters in visual field, right 06/28/2020   History of COVID-19 04/13/2020   Essential hypertension 07/06/2019   Refusal of blood transfusions as patient is Jehovah's Witness 05/23/2014   BPH (benign prostatic hyperplasia) 03/15/2014   Erectile dysfunction 03/15/2014   Leukopenia 05/06/2012   Allergic rhinitis 05/06/2012   Hyperlipidemia      Current Outpatient Medications on File Prior to Visit  Medication Sig Dispense Refill   aspirin EC 81 MG tablet Take 81 mg by mouth daily.     IBUPROFEN  PO Take by mouth.     Multiple Vitamin (MULTIVITAMIN) tablet Take 1 tablet by mouth daily.     timolol (TIMOPTIC) 0.5 % ophthalmic solution INSTILL 1 DROP INTO EACH EYE IN THE MORNING AS DIRECTED     Travoprost, BAK Free, (TRAVATAN) 0.004 % SOLN ophthalmic solution INSTILL 1 DROP INTO EACH EYE ONCE DAILY AT NIGHT     amLODipine  (NORVASC ) 10 MG tablet Take 1 tablet by mouth once daily 90 tablet 1  fluticasone  (FLONASE ) 50 MCG/ACT nasal spray Place 1 spray into both nostrils daily. 16 g 1   tamsulosin  (FLOMAX ) 0.4 MG CAPS capsule Take 0.4 mg by mouth. (Patient not taking: Reported on 06/02/2023)     No current facility-administered medications on file prior to visit.    Allergies  Allergen Reactions   Pseudoephedrine Other (See Comments)    Testicular pain and tenderness   Penicillins Rash    Has patient had a PCN reaction causing immediate rash, facial/tongue/throat swelling, SOB or lightheadedness with hypotension: No Has patient  had a PCN reaction causing severe rash involving mucus membranes or skin necrosis: No Has patient had a PCN reaction that required hospitalization No Has patient had a PCN reaction occurring within the last 10 years: No If all of the above answers are NO, then may proceed with Cephalosporin use.     Social History   Socioeconomic History   Marital status: Married    Spouse name: Not on file   Number of children: Not on file   Years of education: Not on file   Highest education level: 12th grade  Occupational History   Not on file  Tobacco Use   Smoking status: Former    Current packs/day: 1.50    Average packs/day: 1.5 packs/day for 5.0 years (7.5 ttl pk-yrs)    Types: Cigarettes    Passive exposure: Never   Smokeless tobacco: Never  Vaping Use   Vaping status: Never Used  Substance and Sexual Activity   Alcohol use: Yes    Alcohol/week: 7.0 standard drinks of alcohol    Types: 4 Glasses of wine, 3 Cans of beer per week   Drug use: No   Sexual activity: Not on file  Other Topics Concern   Not on file  Social History Narrative   Lives wife wife.  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    Social Drivers of Corporate investment banker Strain: Low Risk  (12/02/2023)   Overall Financial Resource Strain (CARDIA)    Difficulty of Paying Living Expenses: Not hard at all  Food Insecurity: No Food Insecurity (12/02/2023)   Hunger Vital Sign    Worried About Running Out of Food in the Last Year: Never true    Ran Out of Food in the Last Year: Never true  Transportation Needs: No Transportation Needs (12/02/2023)   PRAPARE - Administrator, Civil Service (Medical): No    Lack of Transportation (Non-Medical): No  Physical Activity: Sufficiently Active (12/02/2023)   Exercise Vital Sign    Days of Exercise per Week: 6 days    Minutes of Exercise per Session: 120 min  Stress: No Stress Concern Present (12/02/2023)   [employer] of Occupational  Health - Occupational Stress Questionnaire    Feeling of Stress: Not at all  Social Connections: Socially Integrated (12/02/2023)   Social Connection and Isolation Panel    Frequency of Communication with Friends and Family: More than three times a week    Frequency of Social Gatherings with Friends and Family: More than three times a week    Attends Religious Services: More than 4 times per year    Active Member of [employer] or Organizations: Yes    Attends Banker Meetings: More than 4 times per year    Marital Status: Married  Catering manager Violence: Not At Risk (02/17/2023)   Humiliation, Afraid, Rape, and Kick questionnaire    Fear of Current or Ex-Partner: No  Emotionally Abused: No    Physically Abused: No    Sexually Abused: No    Family History  Problem Relation Age of Onset   Diabetes Mother    Heart disease Mother    Stroke Mother    Clotting disorder Sister        Daughter also with clotting disorder.   Colon cancer Neg Hx    Esophageal cancer Neg Hx    Rectal cancer Neg Hx    Stomach cancer Neg Hx     Past Surgical History:  Procedure Laterality Date   COLONOSCOPY     15 years ago   HERNIA REPAIR     Inguinal     ROS: Review of Systems Negative except as stated above  PHYSICAL EXAM: BP (!) 156/81 (BP Location: Left Arm, Patient Position: Sitting, Cuff Size: Normal)   Pulse (!) 53   Temp 98 F (36.7 C) (Oral)   Ht 5' 8 (1.727 m)   Wt 174 lb (78.9 kg)   SpO2 99%   BMI 26.46 kg/m   Wt Readings from Last 3 Encounters:  12/03/23 174 lb (78.9 kg)  06/02/23 188 lb (85.3 kg)  03/17/23 183 lb (83 kg)    Physical Exam General appearance - alert, well appearing, older African-American male and in no distress Mental status - normal mood, behavior, speech, dress, motor activity, and thought processes Neck - supple, no significant adenopathy, no thyroid  enlargement Chest - clear to auscultation, no wheezes, rales or rhonchi, symmetric air  entry Heart - normal rate, regular rhythm, normal S1, S2, no murmurs, rubs, clicks or gallops Extremities - peripheral pulses normal, no pedal edema, no clubbing or cyanosis     Latest Ref Rng & Units 01/02/2023    8:58 AM 08/12/2022    1:43 PM 10/22/2021   11:29 AM  CMP  Glucose 65 - 99 mg/dL 896  96  87   BUN 7 - 25 mg/dL 18  11  13    Creatinine 0.70 - 1.28 mg/dL 8.94  9.21  9.13   Sodium 135 - 146 mmol/L 140  140  140   Potassium 3.5 - 5.3 mmol/L 4.5  4.7  4.5   Chloride 98 - 110 mmol/L 107  103  106   CO2 20 - 32 mmol/L 27  26  23    Calcium  8.6 - 10.3 mg/dL 89.5  89.4  89.7   Total Protein 6.1 - 8.1 g/dL 6.9   6.7   Total Bilirubin 0.2 - 1.2 mg/dL 0.4   0.6   Alkaline Phos 44 - 121 IU/L   54   AST 10 - 35 U/L 20   17   ALT 9 - 46 U/L 17   13    Lipid Panel     Component Value Date/Time   CHOL 161 10/22/2021 1129   TRIG 57 10/22/2021 1129   HDL 47 10/22/2021 1129   CHOLHDL 3.4 10/22/2021 1129   CHOLHDL 3.3 03/15/2014 1113   VLDL 15 03/15/2014 1113   LDLCALC 102 (H) 10/22/2021 1129   LDLDIRECT 106 (H) 05/05/2012 1032    CBC    Component Value Date/Time   WBC 3.7 06/02/2023 0959   WBC 6.8 06/01/2021 1234   RBC 5.11 06/02/2023 0959   RBC 4.98 06/01/2021 1234   HGB 14.1 06/02/2023 0959   HCT 43.2 06/02/2023 0959   PLT 217 06/02/2023 0959   MCV 85 06/02/2023 0959   MCH 27.6 06/02/2023 0959   MCH 28.3 06/01/2021 1234  MCHC 32.6 06/02/2023 0959   MCHC 33.1 06/01/2021 1234   RDW 13.7 06/02/2023 0959   LYMPHSABS 0.7 09/10/2022 1347   MONOABS 0.5 06/01/2021 1234   EOSABS 0.1 09/10/2022 1347   BASOSABS 0.0 09/10/2022 1347    ASSESSMENT AND PLAN: 1. Essential hypertension (Primary) Not at goal.  Increase amlodipine  to 10 mg daily.  DASH diet encouraged.  Check blood pressure at least twice a week with goal being 130/80 or lower. - amLODipine  (NORVASC ) 10 MG tablet; Take 1 tablet (10 mg total) by mouth daily.  Dispense: 90 tablet; Refill: 1 - CBC - Comprehensive  metabolic panel with GFR  2. Elevated PSA Strongly encouraged him to follow-up with his urologist in regards to this.  3. Weight loss, unintentional Will check baseline blood test today including CBC, chemistry and thyroid  hormone levels.  Encouraged him to try to get in meals on time.  Can take lunch box with him in small cooler so that he is not having lunch so late in the afternoon - TSH+T4F+T3Free  4. Post-nasal drip Refill given on Flonase  - fluticasone  (FLONASE ) 50 MCG/ACT nasal spray; Place 1 spray into both nostrils daily.  Dispense: 16 g; Refill: 1  5. Need for influenza vaccination Given today.  6. Screening for colon cancer Encourage patient to call [facility] back to schedule his colonoscopy especially in light of the weight loss  Patient was given the opportunity to ask questions.  Patient verbalized understanding of the plan and was able to repeat key elements of the plan.   This documentation was completed using Paediatric nurse.  Any transcriptional errors are unintentional.  No orders of the defined types were placed in this encounter.    Requested Prescriptions    No prescriptions requested or ordered in this encounter    No follow-ups on file.  Barnie Louder, MD, FACP

## 2023-12-04 ENCOUNTER — Ambulatory Visit: Payer: Self-pay | Admitting: Internal Medicine

## 2023-12-04 LAB — TSH+T4F+T3FREE
Free T4: 1.09 ng/dL (ref 0.82–1.77)
T3, Free: 3.6 pg/mL (ref 2.0–4.4)
TSH: 1.59 u[IU]/mL (ref 0.450–4.500)

## 2023-12-04 LAB — CBC
Hematocrit: 42.2 % (ref 37.5–51.0)
Hemoglobin: 13.6 g/dL (ref 13.0–17.7)
MCH: 28 pg (ref 26.6–33.0)
MCHC: 32.2 g/dL (ref 31.5–35.7)
MCV: 87 fL (ref 79–97)
Platelets: 268 x10E3/uL (ref 150–450)
RBC: 4.86 x10E6/uL (ref 4.14–5.80)
RDW: 13.6 % (ref 11.6–15.4)
WBC: 3.8 x10E3/uL (ref 3.4–10.8)

## 2023-12-04 LAB — COMPREHENSIVE METABOLIC PANEL WITH GFR
ALT: 14 IU/L (ref 0–44)
AST: 22 IU/L (ref 0–40)
Albumin: 4.6 g/dL (ref 3.8–4.8)
Alkaline Phosphatase: 66 IU/L (ref 44–121)
BUN/Creatinine Ratio: 13 (ref 10–24)
BUN: 12 mg/dL (ref 8–27)
Bilirubin Total: 0.5 mg/dL (ref 0.0–1.2)
CO2: 22 mmol/L (ref 20–29)
Calcium: 10.5 mg/dL — ABNORMAL HIGH (ref 8.6–10.2)
Chloride: 103 mmol/L (ref 96–106)
Creatinine, Ser: 0.91 mg/dL (ref 0.76–1.27)
Globulin, Total: 2.2 g/dL (ref 1.5–4.5)
Glucose: 88 mg/dL (ref 70–99)
Potassium: 5.1 mmol/L (ref 3.5–5.2)
Sodium: 139 mmol/L (ref 134–144)
Total Protein: 6.8 g/dL (ref 6.0–8.5)
eGFR: 90 mL/min/1.73 (ref >59)

## 2024-01-21 ENCOUNTER — Ambulatory Visit: Attending: Internal Medicine | Admitting: Internal Medicine

## 2024-01-21 ENCOUNTER — Encounter: Payer: Self-pay | Admitting: Internal Medicine

## 2024-01-21 VITALS — BP 147/74 | HR 55 | Temp 98.0°F | Ht 68.0 in | Wt 175.0 lb

## 2024-01-21 DIAGNOSIS — R0982 Postnasal drip: Secondary | ICD-10-CM

## 2024-01-21 DIAGNOSIS — Z1211 Encounter for screening for malignant neoplasm of colon: Secondary | ICD-10-CM

## 2024-01-21 DIAGNOSIS — Z0000 Encounter for general adult medical examination without abnormal findings: Secondary | ICD-10-CM | POA: Diagnosis not present

## 2024-01-21 DIAGNOSIS — M7701 Medial epicondylitis, right elbow: Secondary | ICD-10-CM | POA: Diagnosis not present

## 2024-01-21 DIAGNOSIS — I10 Essential (primary) hypertension: Secondary | ICD-10-CM

## 2024-01-21 DIAGNOSIS — H9202 Otalgia, left ear: Secondary | ICD-10-CM

## 2024-01-21 MED ORDER — FLUTICASONE PROPIONATE 50 MCG/ACT NA SUSP: 1.0000 | Freq: Every day | NASAL | 1 refills | Status: AC

## 2024-01-21 NOTE — Patient Instructions (Signed)
 VISIT SUMMARY: Today, you came in for your annual physical exam. We discussed several health concerns, including pain in your right arm, your blood pressure, nasal congestion, and general health maintenance.  YOUR PLAN: -RIGHT MEDIAL ELBOW EPICONDYLITIS: This condition is caused by repetitive motion leading to pain around the inner elbow. You should continue using ibuprofen  as needed for pain relief and consider purchasing an elbow sleeve to provide support and reduce inflammation.  -HYPERTENSION: Your blood pressure is generally well-controlled with your current medication, but you occasionally have higher readings. Continue taking amlodipine  5 mg daily and try to limit your dietary sodium intake to help manage your blood pressure.  -NASAL CONGESTION: You have been experiencing recent nasal congestion, which you are managing with Flonase . Continue using Flonase  as needed.  -GENERAL HEALTH MAINTENANCE: You received your COVID booster recently. It is important to schedule and complete your colon cancer screening and aim for dental cleanings twice a year. Continue with regular eye exams for glaucoma.  INSTRUCTIONS: Please schedule and complete your colon cancer screening as soon as possible. Additionally, aim to have dental cleanings twice a year to maintain good oral health.  Preventive Care 27 Years and Older, Male Preventive care refers to lifestyle choices and visits with your health care provider that can promote health and wellness. Preventive care visits are also called wellness exams. What can I expect for my preventive care visit? Counseling During your preventive care visit, your health care provider may ask about your: Medical history, including: Past medical problems. Family medical history. History of falls. Current health, including: Emotional well-being. Home life and relationship well-being. Sexual activity. Memory and ability to understand (cognition). Lifestyle,  including: Alcohol, nicotine or tobacco, and drug use. Access to firearms. Diet, exercise, and sleep habits. Work and work Astronomer. Sunscreen use. Safety issues such as seatbelt and bike helmet use. Physical exam Your health care provider will check your: Height and weight. These may be used to calculate your BMI (body mass index). BMI is a measurement that tells if you are at a healthy weight. Waist circumference. This measures the distance around your waistline. This measurement also tells if you are at a healthy weight and may help predict your risk of certain diseases, such as type 2 diabetes and high blood pressure. Heart rate and blood pressure. Body temperature. Skin for abnormal spots. What immunizations do I need?  Vaccines are usually given at various ages, according to a schedule. Your health care provider will recommend vaccines for you based on your age, medical history, and lifestyle or other factors, such as travel or where you work. What tests do I need? Screening Your health care provider may recommend screening tests for certain conditions. This may include: Lipid and cholesterol levels. Diabetes screening. This is done by checking your blood sugar (glucose) after you have not eaten for a while (fasting). Hepatitis C test. Hepatitis B test. HIV (human immunodeficiency virus) test. STI (sexually transmitted infection) testing, if you are at risk. Lung cancer screening. Colorectal cancer screening. Prostate cancer screening. Abdominal aortic aneurysm (AAA) screening. You may need this if you are a current or former smoker. Talk with your health care provider about your test results, treatment options, and if necessary, the need for more tests. Follow these instructions at home: Eating and drinking  Eat a diet that includes fresh fruits and vegetables, whole grains, lean protein, and low-fat dairy products. Limit your intake of foods with high amounts of sugar,  saturated fats, and salt. Take  vitamin and mineral supplements as recommended by your health care provider. Do not drink alcohol if your health care provider tells you not to drink. If you drink alcohol: Limit how much you have to 0-2 drinks a day. Know how much alcohol is in your drink. In the [place] one drink equals one 12 oz bottle of beer (355 mL), one 5 oz glass of wine (148 mL), or one 1 oz glass of hard liquor (44 mL). Lifestyle Brush your teeth every morning and night with fluoride toothpaste. Floss one time each day. Exercise for at least 30 minutes 5 or more days each week. Do not use any products that contain nicotine or tobacco. These products include cigarettes, chewing tobacco, and vaping devices, such as e-cigarettes. If you need help quitting, ask your health care provider. Do not use drugs. If you are sexually active, practice safe sex. Use a condom or other form of protection to prevent STIs. Take aspirin only as told by your health care provider. Make sure that you understand how much to take and what form to take. Work with your health care provider to find out whether it is safe and beneficial for you to take aspirin daily. Ask your health care provider if you need to take a cholesterol-lowering medicine (statin). Find healthy ways to manage stress, such as: Meditation, yoga, or listening to music. Journaling. Talking to a trusted person. Spending time with friends and family. Safety Always wear your seat belt while driving or riding in a vehicle. Do not drive: If you have been drinking alcohol. Do not ride with someone who has been drinking. When you are tired or distracted. While texting. If you have been using any mind-altering substances or drugs. Wear a helmet and other protective equipment during sports activities. If you have firearms in your house, make sure you follow all gun safety procedures. Minimize exposure to UV radiation to reduce your risk of skin  cancer. What's next? Visit your health care provider once a year for an annual wellness visit. Ask your health care provider how often you should have your eyes and teeth checked. Stay up to date on all vaccines. This information is not intended to replace advice given to you by your health care provider. Make sure you discuss any questions you have with your health care provider. Document Revised: 09/12/2020 Document Reviewed: 09/12/2020 Elsevier Patient Education  2024 [employer]                      Contains text generated by [employer].                                 Contains text generated by [employer].

## 2024-01-21 NOTE — Progress Notes (Signed)
 Patient ID: Leonard Bryant, male    DOB: 18-Apr-1950  MRN: [id number]  CC: Follow-up (HTN f/u. Med refill. Clorinda on R forearm - unable to sleep /Episode of vertigo when draining L ear /Laready received flu vax)   Subjective: Leonard Bryant is a 73 y.o. male who presents for chronic ds management. His concerns today include:  Pt with hx of HTN, vitiligo, glaucoma, BPH, mild aortic atherosclerosis declines statin, dec hearing, polyarthritis   Discussed the use of AI scribe software for clinical note transcription with the patient, who gave verbal consent to proceed.  History of Present Illness Leonard Bryant is a 73 year old male who presents for an annual physical exam.  He has been experiencing pain in his right arm, specifically around the medial elbow, since last night. The pain began after cutting cabbage using scissors. 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 The pain was sharp initially and has since become less severe, possibly due to ibuprofen  use last night. He reports that a lidocaine patch provides relief, which he attributes to heating the area and offering support. No swelling is noted.  He has a history of hypertension and is currently taking half of a 10 mg Norvasc  tablet daily instead of the full tab as recommended on last visit. He monitors his blood pressure at home, usually recording readings below 130/80 mmHg, but occasionally notes higher readings, such as 153/86 mmHg. He checks his blood pressure at least three times a week and does not always limit his salt intake, mentioning the use of a salty bouillon product recently.  He experiences issues with water retention in his ears after showering, particularly in the left ear. He uses alcohol to displace the water, which recently caused dizziness and a spinning sensation lasting about 30 seconds, accompanied by nausea.  He  reports being congested over the past week and uses Flonase  nasal spray for relief. No chronic cough or shortness of breath is noted.  He has glaucoma and receives regular eye exams, primarily due to insurance coverage. He reports that after glaucoma was discovered, he was prescribed eye drops, and he plans to have an eye exam tomorrow.  He attempts to have dental cleanings once a year.   He received a COVID booster one month ago [place]) and is overdue for colon cancer screening, with a referral made in March but not yet completed. 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/01/2021   Premature atrial contractions 10/01/2020   Aortic atherosclerosis 10/01/2020   PVC (premature ventricular contraction) 06/28/2020   Floaters in visual field, right 06/28/2020   History of COVID-19 04/13/2020   Essential hypertension 07/06/2019   Refusal of blood transfusions as patient is Jehovah's Witness 05/23/2014   BPH (benign prostatic hyperplasia) 03/15/2014   Erectile dysfunction 03/15/2014   Leukopenia 05/06/2012   Allergic rhinitis 05/06/2012   Hyperlipidemia      Current Outpatient Medications on File Prior to Visit  Medication Sig Dispense Refill   amLODipine  (NORVASC ) 10 MG tablet Take 1 tablet (10 mg total) by mouth  daily. 90 tablet 1   aspirin EC 81 MG tablet Take 81 mg by mouth daily.     IBUPROFEN  PO Take by mouth.     Multiple Vitamin (MULTIVITAMIN) tablet Take 1 tablet by mouth daily.     timolol (TIMOPTIC) 0.5 % ophthalmic solution INSTILL 1 DROP INTO EACH EYE IN THE MORNING AS DIRECTED     Travoprost, BAK Free, (TRAVATAN) 0.004 % SOLN ophthalmic solution INSTILL 1 DROP INTO EACH EYE ONCE DAILY AT  NIGHT     No current facility-administered medications on file prior to visit.    Allergies  Allergen Reactions   Pseudoephedrine Other (See Comments)    Testicular pain and tenderness   Penicillins Rash    Has patient had a PCN reaction causing immediate rash, facial/tongue/throat swelling, SOB or lightheadedness with hypotension: No Has patient had a PCN reaction causing severe rash involving mucus membranes or skin necrosis: No Has patient had a PCN reaction that required hospitalization No Has patient had a PCN reaction occurring within the last 10 years: No If all of the above answers are NO, then may proceed with Cephalosporin use.     Social History   Socioeconomic History   Marital status: Married    Spouse name: Not on file   Number of children: Not on file   Years of education: Not on file   Highest education level: 12th grade  Occupational History   Not on file  Tobacco Use   Smoking status: Former    Current packs/day: 1.50    Average packs/day: 1.5 packs/day for 5.0 years (7.5 ttl pk-yrs)    Types: Cigarettes    Passive exposure: Never   Smokeless tobacco: Never  Vaping Use   Vaping status: Never Used  Substance and Sexual Activity   Alcohol use: Yes    Alcohol/week: 7.0 standard drinks of alcohol    Types: 4 Glasses of wine, 3 Cans of beer per week   Drug use: No   Sexual activity: Not on file  Other Topics Concern   Not on file  Social History Narrative   Lives wife wife.  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    Social Drivers of Corporate investment banker Strain: Low Risk  (01/21/2024)   Overall Financial Resource Strain (CARDIA)    Difficulty of Paying Living Expenses: Not hard at all  Food Insecurity: No Food Insecurity (01/21/2024)   Hunger Vital Sign    Worried About Running Out of Food in the Last Year: Never true    Ran Out of Food in the Last Year: Never true  Transportation Needs: No Transportation Needs  (01/21/2024)   PRAPARE - Administrator, Civil Service (Medical): No    Lack of Transportation (Non-Medical): No  Physical Activity: Sufficiently Active (01/21/2024)   Exercise Vital Sign    Days of Exercise per Week: 6 days    Minutes of Exercise per Session: 60 min  Stress: No Stress Concern Present (01/21/2024)   [employer] of Occupational Health - Occupational Stress Questionnaire    Feeling of Stress: Not at all  Social Connections: Socially Integrated (01/21/2024)   Social Connection and Isolation Panel    Frequency of Communication with Friends and Family: More than three times a week    Frequency of Social Gatherings with Friends and Family: More than three times a week    Attends Religious Services: More than 4 times per year    Active Member of [employer]  or Organizations: Yes    Attends Banker Meetings: More than 4 times per year    Marital Status: Married  Catering manager Violence: Not At Risk (01/21/2024)   Humiliation, Afraid, Rape, and Kick questionnaire    Fear of Current or Ex-Partner: No    Emotionally Abused: No    Physically Abused: No    Sexually Abused: No    Family History  Problem Relation Age of Onset   Diabetes Mother    Heart disease Mother    Stroke Mother    Clotting disorder Sister        Daughter also with clotting disorder.   Colon cancer Neg Hx    Esophageal cancer Neg Hx    Rectal cancer Neg Hx    Stomach cancer Neg Hx     Past Surgical History:  Procedure Laterality Date   COLONOSCOPY     15 years ago   HERNIA REPAIR     Inguinal     ROS: Review of Systems  Constitutional:  Negative for activity change.  HENT:  Negative for dental problem.        Reports some decrease hearing and had seen ENT few yrs ago. Told he may need hearing aid. Has trouble hearing conversations at time. Most noticeable by his wife.  Respiratory:  Negative for shortness of breath.   Cardiovascular:  Negative for chest pain and  leg swelling.  Gastrointestinal:  Negative for abdominal pain.       No problems passing his bowel   Negative except as stated above  PHYSICAL EXAM: BP (!) 147/74   Pulse (!) 55   Temp 98 F (36.7 C) (Oral)   Ht 5' 8 (1.727 m)   Wt 175 lb (79.4 kg)   SpO2 99%   BMI 26.61 kg/m   Physical Exam   General appearance - alert, well appearing, elderly AAM and in no distress Mental status - normal mood, behavior, speech, dress, motor activity, and thought processes Eyes - pupils equal and reactive, extraocular eye movements intact, + arcus seniles Ears - bilateral TM's and external ear canals normal Nose - normal and patent, no erythema, discharge or polyps Mouth - mucous membranes moist, pharynx normal without lesions Neck - supple, no significant adenopathy Lymphatics - no palpable lymphadenopathy, no hepatosplenomegaly Chest - clear to auscultation, no wheezes, rales or rhonchi, symmetric air entry Heart - normal rate, regular rhythm, normal S1, S2, no murmurs, rubs, clicks or gallops Abdomen - soft, nontender, nondistended, no masses or organomegaly Musculoskeletal - RT elbow: no edema or erythema. Mild tenderness over medial epicondyle. Good ROM but pain worsens with elbow extension Extremities - peripheral pulses normal, no pedal edema, no clubbing or cyanosis     Latest Ref Rng & Units 12/03/2023    9:35 AM 01/02/2023    8:58 AM 08/12/2022    1:43 PM  CMP  Glucose 70 - 99 mg/dL 88  896  96   BUN 8 - 27 mg/dL 12  18  11    Creatinine 0.76 - 1.27 mg/dL 9.08  8.94  9.21   Sodium 134 - 144 mmol/L 139  140  140   Potassium 3.5 - 5.2 mmol/L 5.1  4.5  4.7   Chloride 96 - 106 mmol/L 103  107  103   CO2 20 - 29 mmol/L 22  27  26    Calcium  8.6 - 10.2 mg/dL 89.4  89.5  89.4   Total Protein 6.0 - 8.5 g/dL 6.8  6.9    Total Bilirubin 0.0 - 1.2 mg/dL 0.5  0.4    Alkaline Phos 44 - 121 IU/L 66     AST 0 - 40 IU/L 22  20    ALT 0 - 44 IU/L 14  17     Lipid Panel     Component Value  Date/Time   CHOL 161 10/22/2021 1129   TRIG 57 10/22/2021 1129   HDL 47 10/22/2021 1129   CHOLHDL 3.4 10/22/2021 1129   CHOLHDL 3.3 03/15/2014 1113   VLDL 15 03/15/2014 1113   LDLCALC 102 (H) 10/22/2021 1129   LDLDIRECT 106 (H) 05/05/2012 1032    CBC    Component Value Date/Time   WBC 3.8 12/03/2023 0935   WBC 6.8 06/01/2021 1234   RBC 4.86 12/03/2023 0935   RBC 4.98 06/01/2021 1234   HGB 13.6 12/03/2023 0935   HCT 42.2 12/03/2023 0935   PLT 268 12/03/2023 0935   MCV 87 12/03/2023 0935   MCH 28.0 12/03/2023 0935   MCH 28.3 06/01/2021 1234   MCHC 32.2 12/03/2023 0935   MCHC 33.1 06/01/2021 1234   RDW 13.6 12/03/2023 0935   LYMPHSABS 0.7 09/10/2022 1347   MONOABS 0.5 06/01/2021 1234   EOSABS 0.1 09/10/2022 1347   BASOSABS 0.0 09/10/2022 1347    ASSESSMENT AND PLAN: 1. Annual physical exam (Primary) Advise routine dental cleaning at lease twice a year Continue f/u with his eye doctor for his glaucoma Continue to stay active. DASH diet discussed and encouraged  2. Essential hypertension Not at goal today but he reports good readings at home with taking 10 mg 1/2 tab daily instead of full tab as advised. Continued 1/2 tab daily DASH diet encouraged  3. Medial epicondylitis, right elbow Acute pain likely from repetitive motion. Pain relieved by ibuprofen  and lidocaine patch.  - Use ibuprofen  as needed for pain for a few days. - Purchase and wear elbow sleeve for support  4. Post-nasal drip RF Flonase  - fluticasone  (FLONASE ) 50 MCG/ACT nasal spray; Place 1 spray into both nostrils daily.  Dispense: 16 g; Refill: 1  5. Ear discomfort, left Advise against putting rubbing alcohol in the ears. Use ear plugs when showering if needed to prevent water from getting in the ears  6. Screening for colon cancer Encouraged him to call to schedule his c-scope if he intends to get it done.   Patient was given the opportunity to ask questions.  Patient verbalized understanding of  the plan and was able to repeat key elements of the plan.   This documentation was completed using Paediatric nurse.  Any transcriptional errors are unintentional.  No orders of the defined types were placed in this encounter.    Requested Prescriptions   Signed Prescriptions Disp Refills   fluticasone  (FLONASE ) 50 MCG/ACT nasal spray 16 g 1    Sig: Place 1 spray into both nostrils daily.    Return in about 6 months (around 07/21/2024).  Barnie Louder, MD, FACP

## 2024-02-23 ENCOUNTER — Ambulatory Visit: Payer: No Typology Code available for payment source | Attending: Internal Medicine

## 2024-02-23 VITALS — Ht 68.0 in | Wt 174.0 lb

## 2024-02-23 DIAGNOSIS — Z0000 Encounter for general adult medical examination without abnormal findings: Secondary | ICD-10-CM | POA: Diagnosis not present

## 2024-02-23 NOTE — Progress Notes (Signed)
 I connected with  Leonard Bryant on 02/23/24 by a audio enabled telemedicine application and verified that I am speaking with the correct person using two identifiers.  Patient Location: Other:  car  Provider Location: Office/Clinic  Persons Participating in Visit: Patient.  I discussed the limitations of evaluation and management by telemedicine. The patient expressed understanding and agreed to proceed.   Vital Signs: Because this visit was a virtual/telehealth visit, some criteria may be missing or patient reported. Any vitals not documented were not able to be obtained and vitals that have been documented are patient reported.     Chief Complaint  Patient presents with   Medicare Wellness    SUBSEQUENT     Subjective:   Leonard Bryant is a 73 y.o. male who presents for a Medicare Annual Wellness Visit.  Allergies (verified) Pseudoephedrine and Penicillins   History: Past Medical History:  Diagnosis Date   Arthritis    Possible arthritis in neck   Hypertension    Kidney stones    Past Surgical History:  Procedure Laterality Date   COLONOSCOPY     15 years ago   HERNIA REPAIR     Inguinal    Family History  Problem Relation Age of Onset   Diabetes Mother    Heart disease Mother    Stroke Mother    Clotting disorder Sister        Daughter also with clotting disorder.   Colon cancer Neg Hx    Esophageal cancer Neg Hx    Rectal cancer Neg Hx    Stomach cancer Neg Hx    Social History   Occupational History   Not on file  Tobacco Use   Smoking status: Former    Current packs/day: 1.50    Average packs/day: 1.5 packs/day for 5.0 years (7.5 ttl pk-yrs)    Types: Cigarettes    Passive exposure: Never   Smokeless tobacco: Never  Vaping Use   Vaping status: Never Used  Substance and Sexual Activity   Alcohol use: Yes    Alcohol/week: 7.0 standard drinks of alcohol    Types: 4 Glasses of wine, 3 Cans of beer per week   Drug use: No   Sexual activity: Not on  file   Tobacco Counseling Counseling given: Not Answered  SDOH Screenings   Food Insecurity: No Food Insecurity (02/23/2024)  Housing: Low Risk  (02/23/2024)  Transportation Needs: No Transportation Needs (02/23/2024)  Utilities: Not At Risk (02/23/2024)  Alcohol Screen: Low Risk  (02/23/2024)  Depression (PHQ2-9): Low Risk  (02/23/2024)  Financial Resource Strain: Low Risk  (02/23/2024)  Physical Activity: Sufficiently Active (02/23/2024)  Social Connections: Socially Integrated (02/23/2024)  Stress: No Stress Concern Present (02/23/2024)  Tobacco Use: Medium Risk (02/23/2024)  Health Literacy: Adequate Health Literacy (02/23/2024)   See flowsheets for full screening details  Depression Screen PHQ 2 & 9 Depression Scale- Over the past 2 weeks, how often have you been bothered by any of the following problems? Little interest or pleasure in doing things: 0 Feeling down, depressed, or hopeless (PHQ Adolescent also includes...irritable): 0 PHQ-2 Total Score: 0 Trouble falling or staying asleep, or sleeping too much: 0 Feeling tired or having little energy: 0 Poor appetite or overeating (PHQ Adolescent also includes...weight loss): 0 Feeling bad about yourself - or that you are a failure or have let yourself or your family down: 0 Trouble concentrating on things, such as reading the newspaper or watching television (PHQ Adolescent also includes...like school  work): 0 Moving or speaking so slowly that other people could have noticed. Or the opposite - being so fidgety or restless that you have been moving around a lot more than usual: 0 Thoughts that you would be better off dead, or of hurting yourself in some way: 0 PHQ-9 Total Score: 0 If you checked off any problems, how difficult have these problems made it for you to do your work, take care of things at home, or get along with other people?: Not difficult at all     Goals Addressed             This Visit's Progress     02/23/24: Start golfing for exercise and bird watching for relaxation.         Visit info / Clinical Intake: Medicare Wellness Visit Type:: Subsequent Annual Wellness Visit Persons participating in visit:: patient Medicare Wellness Visit Mode:: Telephone If telephone:: video declined Because this visit was a virtual/telehealth visit:: pt reported vitals If Telephone or Video please confirm:: The patient expressed understanding and agreed to proceed; I discussed the limitations of evaluation and management by telemedicine; I connected with the patient using audio enabled telemedicine application and verified that I am speaking with the correct person using two identifiers Patient Location:: [facility] Provider Location:: OFFICE Information given by:: patient Interpreter Needed?: No Pre-visit prep was completed: yes AWV questionnaire completed by patient prior to visit?: no Living arrangements:: lives with spouse/significant other Patient's Overall Health Status Rating: very good Typical amount of pain: none Does pain affect daily life?: no Are you currently prescribed opioids?: no  Dietary Habits and Nutritional Risks How many meals a day?: 2 (SMOOTHIE IN THE MORNING) Eats fruit and vegetables daily?: yes Most meals are obtained by: preparing own meals In the last 2 weeks, have you had any of the following?: none Diabetic:: no  Functional Status Activities of Daily Living (to include ambulation/medication): Independent Ambulation: Independent with device- listed below Home Assistive Devices/Equipment: Eyeglasses Medication Administration: Independent Home Management: Independent Manage your own finances?: yes Primary transportation is: driving Concerns about vision?: no *vision screening is required for WTM* Concerns about hearing?: (!) yes Uses hearing aids?: no Hear whispered voice?: (!) no *in-person visit only*  Fall Screening Falls in the past year?:  0 Number of falls in past year: 0 Was there an injury with Fall?: 0 Fall Risk Category Calculator: 0 Patient Fall Risk Level: Low Fall Risk  Fall Risk Patient at Risk for Falls Due to: Impaired vision Fall risk Follow up: Falls evaluation completed; Education provided  Home and Transportation Safety: All rugs have non-skid backing?: N/A, no rugs All stairs or steps have railings?: yes Grab bars in the bathtub or shower?: yes Have non-skid surface in bathtub or shower?: yes Good home lighting?: yes Regular seat belt use?: yes Hospital stays in the last year:: no  Cognitive Assessment Difficulty concentrating, remembering, or making decisions? : no Will 6CIT or Mini Cog be Completed: no 6CIT or Mini Cog Declined: patient alert, oriented, able to answer questions appropriately and recall recent events  Advance Directives (For Healthcare) Does Patient Have a Medical Advance Directive?: No Would patient like information on creating a medical advance directive?: No - Patient declined  Reviewed/Updated  Reviewed/Updated: Reviewed All (Medical, Surgical, Family, Medications, Allergies, Care Teams, Patient Goals)        Objective:    Today's Vitals   02/23/24 0912  Weight: 174 lb (78.9 kg)  Height: 5' 8 (1.727 m)  PainSc: 0-No pain   Body mass index is 26.46 kg/m.  Current Medications (verified) Outpatient Encounter Medications as of 02/23/2024  Medication Sig   amLODipine  (NORVASC ) 10 MG tablet Take 1 tablet (10 mg total) by mouth daily.   aspirin EC 81 MG tablet Take 81 mg by mouth daily.   fluticasone  (FLONASE ) 50 MCG/ACT nasal spray Place 1 spray into both nostrils daily.   IBUPROFEN  PO Take by mouth.   Multiple Vitamin (MULTIVITAMIN) tablet Take 1 tablet by mouth daily.   timolol (TIMOPTIC) 0.5 % ophthalmic solution INSTILL 1 DROP INTO EACH EYE IN THE MORNING AS DIRECTED   Travoprost, BAK Free, (TRAVATAN) 0.004 % SOLN ophthalmic solution INSTILL 1 DROP INTO EACH EYE  ONCE DAILY AT NIGHT   [DISCONTINUED] amLODipine  (NORVASC ) 10 MG tablet Take 1 tablet by mouth once daily   [DISCONTINUED] fluticasone  (FLONASE ) 50 MCG/ACT nasal spray Place 1 spray into both nostrils daily.   [DISCONTINUED] tamsulosin  (FLOMAX ) 0.4 MG CAPS capsule Take 0.4 mg by mouth. (Patient not taking: Reported on 06/02/2023)   No facility-administered encounter medications on file as of 02/23/2024.   Hearing/Vision screen Hearing Screening - Comments:: Patient has issues with hearing, no hearing aids. Vision Screening - Comments:: Wears rx glasses - up to date with routine eye exams with Dr. Waddell Bring  Immunizations and Health Maintenance Health Maintenance  Topic Date Due   Zoster Vaccines- Shingrix (1 of 2) Never done   Pneumococcal Vaccine: 50+ Years (2 of 2 - PCV20 or PCV21) 12/30/2019   Colonoscopy  08/17/2022   COVID-19 Vaccine (6 - 2025-26 season) 02/16/2024   DTaP/Tdap/Td (2 - Td or Tdap) 03/15/2024   Medicare Annual Wellness (AWV)  02/22/2025   Influenza Vaccine  Completed   Hepatitis C Screening  Completed   Meningococcal B Vaccine  Aged Out        Assessment/Plan:  This is a routine wellness examination for Leonard Bryant.  Patient Care Team: Vicci Barnie NOVAK, MD as PCP - General (Internal Medicine) Lavona Agent, MD as PCP - Cardiology (Cardiology) Waddell Bring, OD. as Consulting Physician (Optometry) Jeannetta, Lonni ORN, MD as Consulting Physician (Rheumatology)  I have personally reviewed and noted the following in the patient's chart:   Medical and social history Use of alcohol, tobacco or illicit drugs  Current medications and supplements including opioid prescriptions. Functional ability and status Nutritional status Physical activity Advanced directives List of other physicians Hospitalizations, surgeries, and ER visits in previous 12 months Vitals Screenings to include cognitive, depression, and falls Referrals and appointments  No orders of the  defined types were placed in this encounter.  In addition, I have reviewed and discussed with patient certain preventive protocols, quality metrics, and best practice recommendations. A written personalized care plan for preventive services as well as general preventive health recommendations were provided to patient.   Roz LOISE Fuller, LPN   [id number]   Return in 1 year (on 02/22/2025).  After Visit Summary: (MyChart) Due to this being a telephonic visit, the after visit summary with patients personalized plan was offered to patient via MyChart   Nurse Notes: Patient aware of current care gaps.

## 2024-02-23 NOTE — Patient Instructions (Signed)
 Leonard Bryant,  Thank you for taking the time for your Medicare Wellness Visit. I appreciate your continued commitment to your health goals. Please review the care plan we discussed, and feel free to reach out if I can assist you further.  Please note that Annual Wellness Visits do not include a physical exam. Some assessments may be limited, especially if the visit was conducted virtually. If needed, we may recommend an in-person follow-up with your provider.  Ongoing Care Seeing your primary care provider every 3 to 6 months helps us  monitor your health and provide consistent, personalized care.   Referrals If a referral was made during today's visit and you haven't received any updates within two weeks, please contact the referred provider directly to check on the status.  Recommended Screenings:  Health Maintenance  Topic Date Due   Zoster (Shingles) Vaccine (1 of 2) Never done   Pneumococcal Vaccine for age over 38 (2 of 2 - PCV20 or PCV21) 12/30/2019   Colon Cancer Screening  08/17/2022   COVID-19 Vaccine (6 - 2025-26 season) 02/16/2024   Medicare Annual Wellness Visit  02/17/2024   DTaP/Tdap/Td vaccine (2 - Td or Tdap) 03/15/2024   Flu Shot  Completed   Hepatitis C Screening  Completed   Meningitis B Vaccine  Aged Out       02/23/2024    9:14 AM  Advanced Directives  Does Patient Have a Medical Advance Directive? No  Would patient like information on creating a medical advance directive? No - Patient declined    Vision: Annual vision screenings are recommended for early detection of glaucoma, cataracts, and diabetic retinopathy. These exams can also reveal signs of chronic conditions such as diabetes and high blood pressure.  Dental: Annual dental screenings help detect early signs of oral cancer, gum disease, and other conditions linked to overall health, including heart disease and diabetes.  Please see the attached documents for additional preventive care recommendations.

## 2024-07-21 ENCOUNTER — Ambulatory Visit: Admitting: Internal Medicine
